# Patient Record
Sex: Male | Born: 1965 | Hispanic: Yes | Marital: Single | Smoking: Former smoker
Health system: Southern US, Community
[De-identification: ages and names within clinical notes are randomized; demographics above are authoritative.]

## PROBLEM LIST (undated history)

## (undated) DIAGNOSIS — I10 Essential (primary) hypertension: Secondary | ICD-10-CM

## (undated) DIAGNOSIS — F1010 Alcohol abuse, uncomplicated: Secondary | ICD-10-CM

## (undated) DIAGNOSIS — R109 Unspecified abdominal pain: Secondary | ICD-10-CM

## (undated) DIAGNOSIS — L409 Psoriasis, unspecified: Secondary | ICD-10-CM

---
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
## 2017-03-07 ENCOUNTER — Encounter: Payer: Self-pay | Admitting: Emergency Medicine

## 2017-03-07 ENCOUNTER — Inpatient Hospital Stay
Admission: EM | Admit: 2017-03-07 | Discharge: 2017-03-08 | DRG: 683 | Disposition: A | Discharge status: Home / Self Care | Payer: Self-pay | Attending: Specialist | Admitting: Specialist

## 2017-03-07 DIAGNOSIS — N179 Acute kidney failure, unspecified: Principal | ICD-10-CM | POA: Diagnosis present

## 2017-03-07 DIAGNOSIS — I10 Essential (primary) hypertension: Secondary | ICD-10-CM | POA: Diagnosis present

## 2017-03-07 DIAGNOSIS — R197 Diarrhea, unspecified: Secondary | ICD-10-CM

## 2017-03-07 DIAGNOSIS — E861 Hypovolemia: Secondary | ICD-10-CM | POA: Diagnosis present

## 2017-03-07 DIAGNOSIS — E860 Dehydration: Secondary | ICD-10-CM | POA: Diagnosis present

## 2017-03-07 DIAGNOSIS — Z87891 Personal history of nicotine dependence: Secondary | ICD-10-CM

## 2017-03-07 DIAGNOSIS — A080 Rotaviral enteritis: Secondary | ICD-10-CM | POA: Diagnosis present

## 2017-03-07 HISTORY — DX: Essential (primary) hypertension: I10

## 2017-03-07 LAB — COMPREHENSIVE METABOLIC PANEL
ALK PHOS: 116 U/L (ref 38–126)
ALT: 45 U/L (ref 17–63)
ANION GAP: 13 (ref 5–15)
AST: 53 U/L — ABNORMAL HIGH (ref 15–41)
Albumin: 4.8 g/dL (ref 3.5–5.0)
BUN: 18 mg/dL (ref 6–20)
CALCIUM: 9.7 mg/dL (ref 8.9–10.3)
CO2: 19 mmol/L — AB (ref 22–32)
Chloride: 102 mmol/L (ref 101–111)
Creatinine, Ser: 2.22 mg/dL — ABNORMAL HIGH (ref 0.61–1.24)
GFR calc non Af Amer: 33 mL/min — ABNORMAL LOW (ref >60)
GFR, EST AFRICAN AMERICAN: 39 mL/min — AB (ref >60)
Glucose, Bld: 124 mg/dL — ABNORMAL HIGH (ref 65–99)
Potassium: 4 mmol/L (ref 3.5–5.1)
SODIUM: 134 mmol/L — AB (ref 135–145)
TOTAL PROTEIN: 9.7 g/dL — AB (ref 6.5–8.1)
Total Bilirubin: 0.9 mg/dL (ref 0.3–1.2)

## 2017-03-07 LAB — URINALYSIS, COMPLETE (UACMP) WITH MICROSCOPIC
BILIRUBIN URINE: NEGATIVE
Glucose, UA: NEGATIVE mg/dL
Ketones, ur: NEGATIVE mg/dL
LEUKOCYTES UA: NEGATIVE
NITRITE: NEGATIVE
PH: 5 (ref 5.0–8.0)
Protein, ur: NEGATIVE mg/dL
SPECIFIC GRAVITY, URINE: 1.004 — AB (ref 1.005–1.030)

## 2017-03-07 LAB — GASTROINTESTINAL PANEL BY PCR, STOOL (REPLACES STOOL CULTURE)
ASTROVIRUS: NOT DETECTED
Adenovirus F40/41: NOT DETECTED
Campylobacter species: NOT DETECTED
Cryptosporidium: NOT DETECTED
Cyclospora cayetanensis: NOT DETECTED
ENTAMOEBA HISTOLYTICA: NOT DETECTED
ENTEROAGGREGATIVE E COLI (EAEC): NOT DETECTED
ENTEROTOXIGENIC E COLI (ETEC): NOT DETECTED
Enteropathogenic E coli (EPEC): NOT DETECTED
Giardia lamblia: NOT DETECTED
NOROVIRUS GI/GII: NOT DETECTED
Plesimonas shigelloides: NOT DETECTED
ROTAVIRUS A: DETECTED — AB
SALMONELLA SPECIES: NOT DETECTED
SAPOVIRUS (I, II, IV, AND V): NOT DETECTED
SHIGA LIKE TOXIN PRODUCING E COLI (STEC): NOT DETECTED
SHIGELLA/ENTEROINVASIVE E COLI (EIEC): NOT DETECTED
VIBRIO CHOLERAE: NOT DETECTED
Vibrio species: NOT DETECTED
Yersinia enterocolitica: NOT DETECTED

## 2017-03-07 LAB — CBC
HCT: 54.2 % — ABNORMAL HIGH (ref 40.0–52.0)
HEMOGLOBIN: 18.4 g/dL — AB (ref 13.0–18.0)
MCH: 29 pg (ref 26.0–34.0)
MCHC: 34 g/dL (ref 32.0–36.0)
MCV: 85.2 fL (ref 80.0–100.0)
Platelets: 329 10*3/uL (ref 150–440)
RBC: 6.36 MIL/uL — AB (ref 4.40–5.90)
RDW: 13.9 % (ref 11.5–14.5)
WBC: 9.1 10*3/uL (ref 3.8–10.6)

## 2017-03-07 LAB — C DIFFICILE QUICK SCREEN W PCR REFLEX
C DIFFICILE (CDIFF) INTERP: NOT DETECTED
C DIFFICILE (CDIFF) TOXIN: NEGATIVE
C DIFFICLE (CDIFF) ANTIGEN: NEGATIVE

## 2017-03-07 LAB — LIPASE, BLOOD: LIPASE: 30 U/L (ref 11–51)

## 2017-03-07 MED ORDER — SODIUM CHLORIDE 0.9 % IV BOLUS (SEPSIS)
1000.0000 mL | Freq: Once | INTRAVENOUS | Status: AC
  Administered 2017-03-07: 1000 mL via INTRAVENOUS

## 2017-03-07 MED ORDER — LOPERAMIDE HCL 2 MG PO CAPS
2.0000 mg | ORAL_CAPSULE | Freq: Four times a day (QID) | ORAL | Status: DC | PRN
  Administered 2017-03-08 (×2): 2 mg via ORAL
  Filled 2017-03-07 (×2): qty 1

## 2017-03-07 MED ORDER — HEPARIN SODIUM (PORCINE) 5000 UNIT/ML IJ SOLN
5000.0000 [IU] | Freq: Three times a day (TID) | INTRAMUSCULAR | Status: DC
  Administered 2017-03-07 – 2017-03-08 (×2): 5000 [IU] via SUBCUTANEOUS
  Filled 2017-03-07 (×2): qty 1

## 2017-03-07 MED ORDER — ACETAMINOPHEN 500 MG PO TABS
1000.0000 mg | ORAL_TABLET | Freq: Once | ORAL | Status: AC
  Administered 2017-03-07: 1000 mg via ORAL
  Filled 2017-03-07: qty 2

## 2017-03-07 MED ORDER — ACETAMINOPHEN 650 MG RE SUPP: 650.0000 mg | Freq: Four times a day (QID) | RECTAL | Status: DC | PRN

## 2017-03-07 MED ORDER — ACETAMINOPHEN 325 MG PO TABS
650.0000 mg | ORAL_TABLET | Freq: Four times a day (QID) | ORAL | Status: DC | PRN
  Administered 2017-03-07 – 2017-03-08 (×2): 650 mg via ORAL
  Filled 2017-03-07 (×2): qty 2

## 2017-03-07 MED ORDER — SODIUM CHLORIDE 0.9 % IV SOLN
INTRAVENOUS | Status: DC
  Administered 2017-03-07 – 2017-03-08 (×3): via INTRAVENOUS

## 2017-03-07 NOTE — ED Notes (Signed)
Lab results available reviewed, Creat level noted. No previous labs in computer to compare to.

## 2017-03-07 NOTE — H&P (Signed)
Christopher Norton is an 51 y.o. male.   Chief Complaint: Diarrhea HPI: This is a 51 year old Hispanic male. He says last Monday started having fever and diarrhea. He also had body aches and was taking ibuprofen for couple days. Diarrhea has not slowed. He says had 10 or more stools per day. He's also been taking Lasix for lower extremity edema and has continued to take it during that time. He's been out to eat and drink but he has diarrhea immediately afterwards. Here in the ER is found to be in acute renal failure and hospitalist services were insulted for admission.  Past Medical History:  Diagnosis Date  . Hypertension     History reviewed. No pertinent surgical history.  No family history on file. Social History:  reports that he has quit smoking. He has never used smokeless tobacco. He reports that he drinks alcohol. He reports that he does not use drugs.  Allergies: No Known Allergies   (Not in a hospital admission)  Results for orders placed or performed during the hospital encounter of 03/07/17 (from the past 48 hour(s))  Lipase, blood     Status: None   Collection Time: 03/07/17  9:46 AM  Result Value Ref Range   Lipase 30 11 - 51 U/L  Comprehensive metabolic panel     Status: Abnormal   Collection Time: 03/07/17  9:46 AM  Result Value Ref Range   Sodium 134 (L) 135 - 145 mmol/L   Potassium 4.0 3.5 - 5.1 mmol/L   Chloride 102 101 - 111 mmol/L   CO2 19 (L) 22 - 32 mmol/L   Glucose, Bld 124 (H) 65 - 99 mg/dL   BUN 18 6 - 20 mg/dL   Creatinine, Ser 2.22 (H) 0.61 - 1.24 mg/dL   Calcium 9.7 8.9 - 10.3 mg/dL   Total Protein 9.7 (H) 6.5 - 8.1 g/dL   Albumin 4.8 3.5 - 5.0 g/dL   AST 53 (H) 15 - 41 U/L   ALT 45 17 - 63 U/L   Alkaline Phosphatase 116 38 - 126 U/L   Total Bilirubin 0.9 0.3 - 1.2 mg/dL   GFR calc non Af Amer 33 (L) >60 mL/min   GFR calc Af Amer 39 (L) >60 mL/min    Comment: (NOTE) The eGFR has been calculated using the CKD EPI equation. This  calculation has not been validated in all clinical situations. eGFR's persistently <60 mL/min signify possible Chronic Kidney Disease.    Anion gap 13 5 - 15  CBC     Status: Abnormal   Collection Time: 03/07/17  9:46 AM  Result Value Ref Range   WBC 9.1 3.8 - 10.6 K/uL   RBC 6.36 (H) 4.40 - 5.90 MIL/uL   Hemoglobin 18.4 (H) 13.0 - 18.0 g/dL   HCT 54.2 (H) 40.0 - 52.0 %   MCV 85.2 80.0 - 100.0 fL   MCH 29.0 26.0 - 34.0 pg   MCHC 34.0 32.0 - 36.0 g/dL   RDW 13.9 11.5 - 14.5 %   Platelets 329 150 - 440 K/uL    Comment: PLATELET CLUMPS NOTED ON SMEAR, COUNT APPEARS ADEQUATE  Urinalysis, Complete w Microscopic     Status: Abnormal   Collection Time: 03/07/17  1:04 PM  Result Value Ref Range   Color, Urine YELLOW (A) YELLOW   APPearance CLEAR (A) CLEAR   Specific Gravity, Urine 1.004 (L) 1.005 - 1.030   pH 5.0 5.0 - 8.0   Glucose, UA NEGATIVE NEGATIVE mg/dL  Hgb urine dipstick SMALL (A) NEGATIVE   Bilirubin Urine NEGATIVE NEGATIVE   Ketones, ur NEGATIVE NEGATIVE mg/dL   Protein, ur NEGATIVE NEGATIVE mg/dL   Nitrite NEGATIVE NEGATIVE   Leukocytes, UA NEGATIVE NEGATIVE   RBC / HPF 0-5 0 - 5 RBC/hpf   WBC, UA 0-5 0 - 5 WBC/hpf   Bacteria, UA RARE (A) NONE SEEN   Squamous Epithelial / LPF 0-5 (A) NONE SEEN   Mucous PRESENT    Hyaline Casts, UA PRESENT    No results found.  Review of Systems  Constitutional: Negative for chills and fever.  HENT: Negative for hearing loss.   Eyes: Negative for blurred vision.  Respiratory: Negative for cough.   Cardiovascular: Negative for chest pain.  Gastrointestinal: Positive for diarrhea.  Genitourinary: Negative for dysuria.  Musculoskeletal: Negative for myalgias.  Skin: Negative for rash.  Neurological: Negative for dizziness.    Blood pressure 117/84, pulse 82, temperature 99.9 F (37.7 C), temperature source Oral, resp. rate 15, SpO2 97 %. Physical Exam  Constitutional: He is oriented to person, place, and time. He appears  well-developed and well-nourished. No distress.  HENT:  Head: Normocephalic.  Oral mucosa dry  Eyes: Pupils are equal, round, and reactive to light. EOM are normal.  Neck: Neck supple. No JVD present. No tracheal deviation present. No thyromegaly present.  Cardiovascular: Normal rate and regular rhythm.   No murmur heard. Respiratory: Effort normal and breath sounds normal. No respiratory distress. He has no wheezes.  GI: Soft. Bowel sounds are normal. He exhibits no distension. There is no tenderness.  Musculoskeletal: Normal range of motion. He exhibits no edema or tenderness.  Lymphadenopathy:    He has no cervical adenopathy.  Neurological: He is alert and oriented to person, place, and time. No cranial nerve deficit. Coordination normal.  Skin: Skin is warm and dry.     Assessment/Plan 1. Acute renal failure. Likely mostly from hypovolemia. Also he was taking Lasix certainly impact of this. He was taking NSAIDs for short time which may of caused some acute kidney injury also. We will hold any NSAIDs, Lasix and his lisinopril. We'll give him aggressive IV hydration and recheck his renal function. 2. Diarrhea. He's had some fever with this so suspect infectious in nature. Stool for culture and C. difficile have been ordered and are still pending. If these are positive then we'll initiate antibiotic treatment that's appropriate. If negative then this may be viral and will give supportive care while (course. 3. Hypertension. We will hold his lisinopril because his renal function. We'll monitor his blood pressure at another class of medication if needed. Next line Total time spent 35 minutes  Baxter Hire, MD 03/07/2017, 2:34 PM

## 2017-03-07 NOTE — ED Provider Notes (Signed)
[facility] Emergency Department Provider Note  Time seen: 11:21 AM  I have reviewed the triage vital signs and the nursing notes.   HISTORY  Chief Complaint Back Pain; Fever; and Diarrhea    HPI Christopher Norton is a 51 y.o. male with a past medical history of hypertension who presents to the emergency department for fever, weakness and diarrhea. According to the patient for the past 4 days he has been feeling unwell which she describes a subjective fever with very frequent episodes of diarrhea. States he's having up to 20 episodes of diarrhea per day which continues today. Denies any vomiting. Patient states subjective fever at home, 99.9 in the emergency department. States abdominal cramping but denies any "pain." Patient states feelings of generalized fatigue/weakness. He also states diffuse body aches/cramps.  Past Medical History:  Diagnosis Date  . Hypertension     There are no active problems to display for this patient.   History reviewed. No pertinent surgical history.  Prior to Admission medications   Not on File    No Known Allergies  No family history on file.  Social History Social History  Substance Use Topics  . Smoking status: Former Games developermoker  . Smokeless tobacco: Never Used  . Alcohol use Yes    Review of Systems Constitutional: Positive for subjective fever Cardiovascular: Negative for chest pain. Respiratory: Negative for shortness of breath. Gastrointestinal: Positive for abdominal cramping. Negative for vomiting. Positive for diarrhea. Genitourinary: Negative for dysuria. Negative for hematuria Musculoskeletal: Positive for mild lower back pain Neurological: Negative for headache All other ROS negative  ____________________________________________   PHYSICAL EXAM:  VITAL SIGNS: ED Triage Vitals [03/07/17 0940]  Enc Vitals Group     BP (!) 146/128     Pulse Rate (!) 110     Resp 16     Temp 99.9 F  (37.7 C)     Temp Source Oral     SpO2 97 %     Weight      Height      Head Circumference      Peak Flow      Pain Score 8     Pain Loc      Pain Edu?      Excl. in GC?     Constitutional: Alert and oriented. Well appearing and in no distress. Eyes: Normal exam ENT   Head: Normocephalic and atraumatic.   Mouth/Throat: Mucous membranes are moist. Cardiovascular: Normal rate, regular rhythm. No murmur Respiratory: Normal respiratory effort without tachypnea nor retractions. Breath sounds are clear  Gastrointestinal: Soft, nontender, no distention. Musculoskeletal: Nontender with normal range of motion in all extremities. Neurologic:  Normal speech and language. No gross focal neurologic deficits  Skin:  Skin is warm, dry and intact.  Psychiatric: Mood and affect are normal.   ____________________________________________    INITIAL IMPRESSION / ASSESSMENT AND PLAN / ED COURSE  Pertinent labs & imaging results that were available during my care of the patient were reviewed by me and considered in my medical decision making (see chart for details).  Patient presents to the emergency department with subjective fever and diarrhea and generalized weakness. Patient's labs show a creatinine of 2.22, baseline creatinine of 0.8-1.0. Anion gap of 13 with a concentrated hemoglobin most consistent with significant dehydration. Patient denies any vomiting but continues to state up to 20 bowel movements per day of diarrhea. We will place on enteric precautions, obtain a C. difficile and bio fire test.  We will begin IV hydration. Given the patient's generalized weakness with acute renal insufficiency, the patient would most likely benefit from admission to the hospital for further treatment. We will discuss this once further labs are known.   Urinalysis is largely negative. Highly suspect acute renal insufficiency due to dehydration and significant diarrhea. We'll admit to the hospital for  further IV hydration while awaiting stool results.  FINAL CLINICAL IMPRESSION(S) / ED DIAGNOSES acute renal insufficiency Diarrhea   Minna AntisPaduchowski, Audrionna Lampton, MD 03/07/17 1346

## 2017-03-07 NOTE — ED Triage Notes (Signed)
Pt to ED via POV, pt states that he has been having joint pain, fever, diarrhea, and lower back pain since Monday. Pt denies any known tick bites recently. Pt does not appear to be in any distress at this time.

## 2017-03-07 NOTE — ED Notes (Signed)
Patient here for joint pain, fever, diarrhea, and lower back pain since Monday. Patient ambulatory to room. Even and non labored respirations noted.

## 2017-03-07 NOTE — ED Notes (Signed)
Patient made aware of need of urine and stool sample. States he is unable to go at this time. Patient given urinal. Hat in toilet.

## 2017-03-07 NOTE — Progress Notes (Signed)
Pt complained of having multiple loose BM. Primary nurse  notified Dr. Anne HahnWillis. Dr. Anne HahnWillis to place orders. Primary nurse to continue to monitor.

## 2017-03-08 LAB — BASIC METABOLIC PANEL
ANION GAP: 7 (ref 5–15)
BUN: 14 mg/dL (ref 6–20)
CO2: 21 mmol/L — ABNORMAL LOW (ref 22–32)
CREATININE: 1.05 mg/dL (ref 0.61–1.24)
Calcium: 8.6 mg/dL — ABNORMAL LOW (ref 8.9–10.3)
Chloride: 112 mmol/L — ABNORMAL HIGH (ref 101–111)
Glucose, Bld: 89 mg/dL (ref 65–99)
Potassium: 4.4 mmol/L (ref 3.5–5.1)
SODIUM: 140 mmol/L (ref 135–145)

## 2017-03-08 MED ORDER — LOPERAMIDE HCL 2 MG PO CAPS: 2.0000 mg | ORAL_CAPSULE | Freq: Four times a day (QID) | ORAL | 0 refills | Status: DC | PRN

## 2017-03-08 NOTE — Progress Notes (Signed)
Pt to be discharged per MD order. IV removed. Imodium script given. Pt verbalizes understanding of the discharge instructions.

## 2017-03-09 LAB — HIV ANTIBODY (ROUTINE TESTING W REFLEX): HIV Screen 4th Generation wRfx: NONREACTIVE

## 2017-03-09 NOTE — Discharge Summary (Signed)
[facility] [place] [facility]   PATIENT NAME: Christopher Norton    MR#:  [id number]  DATE OF BIRTH:  [birth year]  DATE OF ADMISSION:  03/07/2017 ADMITTING PHYSICIAN: Gracelyn Nurse, MD  DATE OF DISCHARGE: 03/08/2017 12:03 PM  PRIMARY CARE PHYSICIAN: Patient, No Pcp Per    ADMISSION DIAGNOSIS:  Dehydration [E86.0] AKI (acute kidney injury) (HCC) [N17.9] Diarrhea, unspecified type [R19.7]  DISCHARGE DIAGNOSIS:  Active Problems:   Acute renal failure (ARF) (HCC)   SECONDARY DIAGNOSIS:   Past Medical History:  Diagnosis Date  . Hypertension     HOSPITAL COURSE:   51 year old male with past medical history of essential hypertension who presented to the hospital due to worsening diarrhea and noted to be in acute kidney injury.   1. Acute diarrhea-this is secondary to acute viral gastroenteritis secondary to rotavirus. -Patient was treated supportively with IV fluids, antiemetics and supportive care and has improved. He has no further diarrhea. He was discharged on some Imodium as needed.  2. Acute kidney injury-secondary to dehydration from the diarrhea. Patient was hydrated with IV fluids and creatinine has not improved and is back to baseline.  DISCHARGE CONDITIONS:   Stable  CONSULTS OBTAINED:    DRUG ALLERGIES:  No Known Allergies  DISCHARGE MEDICATIONS:   Allergies as of 03/08/2017   No Known Allergies     Medication List    TAKE these medications   acetaminophen 500 MG tablet Commonly known as:  TYLENOL Take 500 mg by mouth every 6 (six) hours as needed. Notes to patient:  Last dose given today at 6:30am   loperamide 2 MG capsule Commonly known as:  IMODIUM Take 1 capsule (2 mg total) by mouth every 6 (six) hours as needed for diarrhea or loose stools. Notes to patient:  Last dose given today at 6:30am         DISCHARGE INSTRUCTIONS:   DIET:  Regular diet  DISCHARGE CONDITION:  Stable  ACTIVITY:  Activity as  tolerated  OXYGEN:  Home Oxygen: No.   Oxygen Delivery: room air  DISCHARGE LOCATION:  home   If you experience worsening of your admission symptoms, develop shortness of breath, life threatening emergency, suicidal or homicidal thoughts you must seek medical attention immediately by calling 911 or calling your MD immediately  if symptoms less severe.  You Must read complete instructions/literature along with all the possible adverse reactions/side effects for all the Medicines you take and that have been prescribed to you. Take any new Medicines after you have completely understood and accpet all the possible adverse reactions/side effects.   Please note  You were cared for by a hospitalist during your hospital stay. If you have any questions about your discharge medications or the care you received while you were in the hospital after you are discharged, you can call the unit and asked to speak with the hospitalist on call if the hospitalist that took care of you is not available. Once you are discharged, your primary care physician will handle any further medical issues. Please note that NO REFILLS for any discharge medications will be authorized once you are discharged, as it is imperative that you return to your primary care physician (or establish a relationship with a primary care physician if you do not have one) for your aftercare needs so that they can reassess your need for medications and monitor your lab values.     Today   No abdominal pain, nausea, vomiting. No diarrhea.  VITAL SIGNS:  Blood pressure 116/79, pulse 71, temperature 98.2 F (36.8 C), temperature source Oral, resp. rate 16, SpO2 99 %.  I/O:  No intake or output data in the 24 hours ending 03/09/17 1401  PHYSICAL EXAMINATION:  GENERAL:  51 y.o.-year-old obese patient lying in the bed with no acute distress.  EYES: Pupils equal, round, reactive to light and accommodation. No scleral icterus. Extraocular  muscles intact.  HEENT: Head atraumatic, normocephalic. Oropharynx and nasopharynx clear.  NECK:  Supple, no jugular venous distention. No thyroid enlargement, no tenderness.  LUNGS: Normal breath sounds bilaterally, no wheezing, rales,rhonchi. No use of accessory muscles of respiration.  CARDIOVASCULAR: S1, S2 normal. No murmurs, rubs, or gallops.  ABDOMEN: Soft, non-tender, non-distended. Bowel sounds present. No organomegaly or mass.  EXTREMITIES: No pedal edema, cyanosis, or clubbing.  NEUROLOGIC: Cranial nerves II through XII are intact. No focal motor or sensory defecits b/l.  PSYCHIATRIC: The patient is alert and oriented x 3. Good affect.  SKIN: No obvious rash, lesion, or ulcer. Scaly white rash throughout the lower extremity and also lower back consistent with psoriasis.  DATA REVIEW:   CBC  Recent Labs Lab 03/07/17 0946  WBC 9.1  HGB 18.4*  HCT 54.2*  PLT 329    Chemistries   Recent Labs Lab 03/07/17 0946 03/08/17 0433  NA 134* 140  K 4.0 4.4  CL 102 112*  CO2 19* 21*  GLUCOSE 124* 89  BUN 18 14  CREATININE 2.22* 1.05  CALCIUM 9.7 8.6*  AST 53*  --   ALT 45  --   ALKPHOS 116  --   BILITOT 0.9  --     Cardiac Enzymes No results for input(s): TROPONINI in the last 168 hours.  Microbiology Results  Results for orders placed or performed during the hospital encounter of 03/07/17  C difficile quick scan w PCR reflex     Status: None   Collection Time: 03/07/17  1:53 PM  Result Value Ref Range Status   C Diff antigen NEGATIVE NEGATIVE Final   C Diff toxin NEGATIVE NEGATIVE Final   C Diff interpretation No C. difficile detected.  Final  Gastrointestinal Panel by PCR , Stool     Status: Abnormal   Collection Time: 03/07/17  1:53 PM  Result Value Ref Range Status   Campylobacter species NOT DETECTED NOT DETECTED Final   Plesimonas shigelloides NOT DETECTED NOT DETECTED Final   Salmonella species NOT DETECTED NOT DETECTED Final   Yersinia enterocolitica  NOT DETECTED NOT DETECTED Final   Vibrio species NOT DETECTED NOT DETECTED Final   Vibrio cholerae NOT DETECTED NOT DETECTED Final   Enteroaggregative E coli (EAEC) NOT DETECTED NOT DETECTED Final   Enteropathogenic E coli (EPEC) NOT DETECTED NOT DETECTED Final   Enterotoxigenic E coli (ETEC) NOT DETECTED NOT DETECTED Final   Shiga like toxin producing E coli (STEC) NOT DETECTED NOT DETECTED Final   Shigella/Enteroinvasive E coli (EIEC) NOT DETECTED NOT DETECTED Final   Cryptosporidium NOT DETECTED NOT DETECTED Final   Cyclospora cayetanensis NOT DETECTED NOT DETECTED Final   Entamoeba histolytica NOT DETECTED NOT DETECTED Final   Giardia lamblia NOT DETECTED NOT DETECTED Final   Adenovirus F40/41 NOT DETECTED NOT DETECTED Final   Astrovirus NOT DETECTED NOT DETECTED Final   Norovirus GI/GII NOT DETECTED NOT DETECTED Final   Rotavirus A DETECTED (A) NOT DETECTED Final   Sapovirus (I, II, IV, and V) NOT DETECTED NOT DETECTED Final    RADIOLOGY:  No results found.  Management plans discussed with the patient, family and they are in agreement.  CODE STATUS:  Code Status History    Date Active Date Inactive Code Status Order ID Comments User Context   03/07/2017  4:21 PM 03/08/2017  3:03 PM Full Code [id number]  Gracelyn NurseJohnston, John D, MD Inpatient      TOTAL TIME TAKING CARE OF THIS PATIENT: 40 minutes.    [place] SirenSAINANI,VIVEK J M.D on 03/09/2017 at 2:01 PM  Between 7am to 6pm - Pager - [phone]  After 6pm go to [email or web address] - Social research officer, [email or web address] [email or web address]  [facility] [place] [facility]  Office  [phone]  CC: Primary care physician; Patient, No Pcp Per

## 2018-04-29 ENCOUNTER — Encounter: Payer: Self-pay | Admitting: Emergency Medicine

## 2018-04-29 ENCOUNTER — Emergency Department
Admission: EM | Admit: 2018-04-29 | Discharge: 2018-04-29 | Disposition: A | Discharge status: Home / Self Care | Payer: Self-pay | Attending: Emergency Medicine | Admitting: Emergency Medicine

## 2018-04-29 ENCOUNTER — Other Ambulatory Visit: Payer: Self-pay

## 2018-04-29 DIAGNOSIS — Z87891 Personal history of nicotine dependence: Secondary | ICD-10-CM | POA: Insufficient documentation

## 2018-04-29 DIAGNOSIS — M5442 Lumbago with sciatica, left side: Secondary | ICD-10-CM | POA: Insufficient documentation

## 2018-04-29 DIAGNOSIS — M62830 Muscle spasm of back: Secondary | ICD-10-CM | POA: Insufficient documentation

## 2018-04-29 DIAGNOSIS — M5432 Sciatica, left side: Secondary | ICD-10-CM

## 2018-04-29 DIAGNOSIS — Z79899 Other long term (current) drug therapy: Secondary | ICD-10-CM | POA: Insufficient documentation

## 2018-04-29 DIAGNOSIS — I10 Essential (primary) hypertension: Secondary | ICD-10-CM | POA: Insufficient documentation

## 2018-04-29 MED ORDER — OXYCODONE-ACETAMINOPHEN 5-325 MG PO TABS
1.0000 | ORAL_TABLET | Freq: Once | ORAL | Status: AC
  Administered 2018-04-29: 1 via ORAL
  Filled 2018-04-29: qty 1

## 2018-04-29 MED ORDER — IBUPROFEN 800 MG PO TABS: 800.0000 mg | ORAL_TABLET | Freq: Three times a day (TID) | ORAL | 0 refills | Status: DC | PRN

## 2018-04-29 MED ORDER — OXYCODONE-ACETAMINOPHEN 5-325 MG PO TABS: 1.0000 | ORAL_TABLET | ORAL | 0 refills | Status: DC | PRN

## 2018-04-29 MED ORDER — DIAZEPAM 2 MG PO TABS
2.0000 mg | ORAL_TABLET | Freq: Once | ORAL | Status: AC
  Administered 2018-04-29: 2 mg via ORAL
  Filled 2018-04-29: qty 1

## 2018-04-29 MED ORDER — KETOROLAC TROMETHAMINE 30 MG/ML IJ SOLN
60.0000 mg | Freq: Once | INTRAMUSCULAR | Status: AC
  Administered 2018-04-29: 60 mg via INTRAMUSCULAR
  Filled 2018-04-29: qty 2

## 2018-04-29 MED ORDER — DIAZEPAM 2 MG PO TABS: 2.0000 mg | ORAL_TABLET | Freq: Three times a day (TID) | ORAL | 0 refills | Status: DC | PRN

## 2018-04-29 NOTE — ED Triage Notes (Signed)
Patient ambulatory to triage with slow steady gait, without difficulty or distress noted; pt reports left lower back/hip pain since last night radiating down left leg with no known injuries; st hx of same and rx flexeril--took ds 3hrs PTA without relief

## 2018-04-29 NOTE — Discharge Instructions (Signed)
1.  You may take medicines as needed for pain and muscle spasms (Motrin/Percocet/Valium #15). 2.  Apply moist heat to affected area several times daily. 3.  Return to the ER for worsening symptoms, persistent vomiting, difficulty breathing, losing control of your bowel or bladder, or other concerns.

## 2018-04-29 NOTE — ED Provider Notes (Signed)
[facility] Emergency Department Provider Note   ____________________________________________   First MD Initiated Contact with Patient 04/29/18 0425     (approximate)  I have reviewed the triage vital signs and the nursing notes.   HISTORY  Chief Complaint Back Pain    HPI Christopher Norton is a 52 y.o. male who presents to the ED from home with a chief complaint of nontraumatic back pain.  Patient reports pain to his left lower back radiating to his hip and down his leg starting last night approximately 11 PM.  Took a Flexeril 3 hours prior to arrival without relief of symptoms.  Similar symptoms 6 to 7 years ago.  Denies recent fever, chills, chest pain, shortness of breath, abdominal pain, nausea, vomiting or dysuria.  Denies extremity weakness, numbness or tingling.  Denies recent travel or trauma.  Denies anticoagulant use.   Past Medical History:  Diagnosis Date  . Hypertension     Patient Active Problem List   Diagnosis Date Noted  . Acute renal failure (ARF) (HCC) 03/07/2017    History reviewed. No pertinent surgical history.  Prior to Admission medications   Medication Sig Start Date End Date Taking? Authorizing Provider  acetaminophen (TYLENOL) 500 MG tablet Take 500 mg by mouth every 6 (six) hours as needed.    [provider]  diazepam (VALIUM) 2 MG tablet Take 1 tablet (2 mg total) by mouth every 8 (eight) hours as needed for muscle spasms. 04/29/18   Irean HongSung, Aileene Lanum J, MD  ibuprofen (ADVIL,MOTRIN) 800 MG tablet Take 1 tablet (800 mg total) by mouth every 8 (eight) hours as needed for moderate pain. 04/29/18   Irean HongSung, Parisha Beaulac J, MD  loperamide (IMODIUM) 2 MG capsule Take 1 capsule (2 mg total) by mouth every 6 (six) hours as needed for diarrhea or loose stools. 03/08/17   Houston SirenSainani, Vivek J, MD  oxyCODONE-acetaminophen (PERCOCET/ROXICET) 5-325 MG tablet Take 1 tablet by mouth every 4 (four) hours as needed for severe pain. 04/29/18    Irean HongSung, Joyclyn Plazola J, MD    Allergies Patient has no known allergies.  No family history on file.  Social History Social History   Tobacco Use  . Smoking status: Former Smoker    Types: Cigarettes    Last attempt to quit: 08/09/2016    Years since quitting: 1.7  . Smokeless tobacco: Never Used  Substance Use Topics  . Alcohol use: Yes    Alcohol/week: 12.0 standard drinks    Types: 12 Cans of beer per week  . Drug use: No    Review of Systems  Constitutional: No fever/chills Eyes: No visual changes. ENT: No sore throat. Cardiovascular: Denies chest pain. Respiratory: Denies shortness of breath. Gastrointestinal: No abdominal pain.  No nausea, no vomiting.  No diarrhea.  No constipation. Genitourinary: Negative for dysuria. Musculoskeletal: Positive for back pain. Skin: Negative for rash. Neurological: Negative for headaches, focal weakness or numbness.   ____________________________________________   PHYSICAL EXAM:  VITAL SIGNS: ED Triage Vitals  Enc Vitals Group     BP 04/29/18 0232 (!) 136/92     Pulse Rate 04/29/18 0232 (!) 107     Resp 04/29/18 0232 18     Temp 04/29/18 0232 98 F (36.7 C)     Temp Source 04/29/18 0232 Oral     SpO2 04/29/18 0232 97 %     Weight 04/29/18 0231 248 lb (112.5 kg)     Height 04/29/18 0231 5\' 6"  (1.676 m)  Head Circumference --      Peak Flow --      Pain Score 04/29/18 0231 10     Pain Loc --      Pain Edu? --      Excl. in GC? --     Constitutional: Alert and oriented. Well appearing and in mild acute distress. Eyes: Conjunctivae are normal. PERRL. EOMI. Head: Atraumatic. Nose: No congestion/rhinnorhea. Mouth/Throat: Mucous membranes are moist.  Oropharynx non-erythematous. Neck: No stridor.  No cervical spine tenderness to palpation. Cardiovascular: Normal rate, regular rhythm. Grossly normal heart sounds.  Good peripheral circulation. Respiratory: Normal respiratory effort.  No retractions. Lungs  CTAB. Gastrointestinal: Soft and nontender. No distention. No abdominal bruits. No CVA tenderness. Musculoskeletal: No spinal tenderness to palpation.  Left paraspinal lumbar spasms.  Negative straight leg raise.  No lower extremity tenderness nor edema.  No joint effusions. Neurologic:  Normal speech and language. No gross focal neurologic deficits are appreciated.  Ambulating with slow but steady gait. Skin:  Skin is warm, dry and intact.  Psoriatic plaques noted.   Psychiatric: Mood and affect are normal. Speech and behavior are normal.  ____________________________________________   LABS (all labs ordered are listed, but only abnormal results are displayed)  Labs Reviewed - No data to display ____________________________________________  EKG  None ____________________________________________  RADIOLOGY  ED MD interpretation: None  Official radiology report(s): No results found.  ____________________________________________   PROCEDURES  Procedure(s) performed: None  Procedures  Critical Care performed: No  ____________________________________________   INITIAL IMPRESSION / ASSESSMENT AND PLAN / ED COURSE  As part of my medical decision making, I reviewed the following data within the electronic MEDICAL RECORD NUMBER Nursing notes reviewed and incorporated and Notes from prior ED visits   52 year old presents with left lower back pain suggestive of sciatica.  Will treat with NSAIDs, analgesia, muscle relaxer and patient will follow-up with orthopedics.  Strict return precautions given.  Patient and spouse verbalize understanding and agree with plan of care.      ____________________________________________   FINAL CLINICAL IMPRESSION(S) / ED DIAGNOSES  Final diagnoses:  Sciatica of left side  Muscle spasm of back     ED Discharge Orders         Ordered    ibuprofen (ADVIL,MOTRIN) 800 MG tablet  Every 8 hours PRN     04/29/18 0445     oxyCODONE-acetaminophen (PERCOCET/ROXICET) 5-325 MG tablet  Every 4 hours PRN     04/29/18 0445    diazepam (VALIUM) 2 MG tablet  Every 8 hours PRN     04/29/18 0445           Note:  This document was prepared using Dragon voice recognition software and may include unintentional dictation errors.    Irean Hong, MD 04/29/18 [phone]

## 2019-06-15 ENCOUNTER — Encounter: Payer: Self-pay | Admitting: Emergency Medicine

## 2019-06-15 ENCOUNTER — Emergency Department
Admission: EM | Admit: 2019-06-15 | Discharge: 2019-06-15 | Disposition: A | Discharge status: Home / Self Care | Payer: Self-pay | Attending: Emergency Medicine | Admitting: Emergency Medicine

## 2019-06-15 ENCOUNTER — Emergency Department: Payer: Self-pay

## 2019-06-15 ENCOUNTER — Other Ambulatory Visit: Payer: Self-pay

## 2019-06-15 DIAGNOSIS — Z20822 Contact with and (suspected) exposure to covid-19: Secondary | ICD-10-CM

## 2019-06-15 DIAGNOSIS — Z87891 Personal history of nicotine dependence: Secondary | ICD-10-CM | POA: Insufficient documentation

## 2019-06-15 DIAGNOSIS — I10 Essential (primary) hypertension: Secondary | ICD-10-CM | POA: Insufficient documentation

## 2019-06-15 DIAGNOSIS — R519 Headache, unspecified: Secondary | ICD-10-CM | POA: Insufficient documentation

## 2019-06-15 LAB — COMPREHENSIVE METABOLIC PANEL
ALT: 175 U/L — ABNORMAL HIGH (ref 0–44)
AST: 203 U/L — ABNORMAL HIGH (ref 15–41)
Albumin: 4 g/dL (ref 3.5–5.0)
Alkaline Phosphatase: 97 U/L (ref 38–126)
Anion gap: 13 (ref 5–15)
BUN: 13 mg/dL (ref 6–20)
CO2: 23 mmol/L (ref 22–32)
Calcium: 9.1 mg/dL (ref 8.9–10.3)
Chloride: 99 mmol/L (ref 98–111)
Creatinine, Ser: 1.17 mg/dL (ref 0.61–1.24)
GFR calc Af Amer: >60 mL/min (ref >60)
GFR calc non Af Amer: >60 mL/min (ref >60)
Glucose, Bld: 104 mg/dL — ABNORMAL HIGH (ref 70–99)
Potassium: 3.8 mmol/L (ref 3.5–5.1)
Sodium: 135 mmol/L (ref 135–145)
Total Bilirubin: 0.7 mg/dL (ref 0.3–1.2)
Total Protein: 8.3 g/dL — ABNORMAL HIGH (ref 6.5–8.1)

## 2019-06-15 LAB — CBC WITH DIFFERENTIAL/PLATELET
Abs Immature Granulocytes: 0.03 10*3/uL (ref 0.00–0.07)
Basophils Absolute: 0 10*3/uL (ref 0.0–0.1)
Basophils Relative: 0 %
Eosinophils Absolute: 0 10*3/uL (ref 0.0–0.5)
Eosinophils Relative: 0 %
HCT: 47.1 % (ref 39.0–52.0)
Hemoglobin: 15.8 g/dL (ref 13.0–17.0)
Immature Granulocytes: 0 %
Lymphocytes Relative: 22 %
Lymphs Abs: 1.5 10*3/uL (ref 0.7–4.0)
MCH: 28.9 pg (ref 26.0–34.0)
MCHC: 33.5 g/dL (ref 30.0–36.0)
MCV: 86.1 fL (ref 80.0–100.0)
Monocytes Absolute: 0.9 10*3/uL (ref 0.1–1.0)
Monocytes Relative: 13 %
Neutro Abs: 4.4 10*3/uL (ref 1.7–7.7)
Neutrophils Relative %: 65 %
Platelets: 205 10*3/uL (ref 150–400)
RBC: 5.47 MIL/uL (ref 4.22–5.81)
RDW: 13 % (ref 11.5–15.5)
WBC: 6.8 10*3/uL (ref 4.0–10.5)
nRBC: 0 % (ref 0.0–0.2)

## 2019-06-15 LAB — TROPONIN I (HIGH SENSITIVITY): Troponin I (High Sensitivity): 10 ng/L (ref <18)

## 2019-06-15 MED ORDER — ACETAMINOPHEN 500 MG PO TABS: 1000.0000 mg | ORAL_TABLET | Freq: Once | ORAL | Status: DC

## 2019-06-15 MED ORDER — ACETAMINOPHEN 500 MG PO TABS
1000.0000 mg | ORAL_TABLET | Freq: Once | ORAL | Status: AC
  Administered 2019-06-15: 1000 mg via ORAL
  Filled 2019-06-15: qty 2

## 2019-06-15 NOTE — ED Notes (Signed)
States his sx's started 6 days ago  But states he conts to have fever and h/a

## 2019-06-15 NOTE — ED Provider Notes (Signed)
[name] Emergency Department Provider Note  ____________________________________________   First MD Initiated Contact with Patient 06/15/19 1443     (approximate)  I have reviewed the triage vital signs and the nursing notes.   HISTORY  Chief Complaint Fever and Headache    HPI Christopher Norton is a 53 y.o. male presents emergency department with complaints of worsening Covid symptoms.  Symptoms for 6 days.  States he is continued to have fever and headaches.  He was taken over-the-counter Tylenol Cold without any relief.  He denies any chest pain or shortness of breath.  He did go to the drive-through today for Covid testing.    Past Medical History:  Diagnosis Date  . Hypertension     Patient Active Problem List   Diagnosis Date Noted  . Acute renal failure (ARF) (HCC) 03/07/2017    History reviewed. No pertinent surgical history.  Prior to Admission medications   Medication Sig Start Date End Date Taking? Authorizing Provider  acetaminophen (TYLENOL) 500 MG tablet Take 500 mg by mouth every 6 (six) hours as needed.    [provider]  diazepam (VALIUM) 2 MG tablet Take 1 tablet (2 mg total) by mouth every 8 (eight) hours as needed for muscle spasms. 04/29/18   Irean Hong, MD    Allergies Patient has no known allergies.  No family history on file.  Social History Social History   Tobacco Use  . Smoking status: Former Smoker    Types: Cigarettes    Quit date: 08/09/2016    Years since quitting: 2.8  . Smokeless tobacco: Never Used  Substance Use Topics  . Alcohol use: Yes    Alcohol/week: 12.0 standard drinks    Types: 12 Cans of beer per week  . Drug use: No    Review of Systems  Constitutional: Positive fever/chills Eyes: No visual changes. ENT: No sore throat. Respiratory: Positive cough Genitourinary: Negative for dysuria. Musculoskeletal: Negative for back pain. Skin: Negative for rash.     ____________________________________________   PHYSICAL EXAM:  VITAL SIGNS: ED Triage Vitals  Enc Vitals Group     BP 06/15/19 1412 (!) 154/104     Pulse Rate 06/15/19 1412 (!) 107     Resp 06/15/19 1412 16     Temp 06/15/19 1412 (!) 100.7 F (38.2 C)     Temp Source 06/15/19 1412 Oral     SpO2 06/15/19 1412 94 %     Weight 06/15/19 1413 242 lb (109.8 kg)     Height 06/15/19 1413 5\' 4"  (1.626 m)     Head Circumference --      Peak Flow --      Pain Score 06/15/19 1412 8     Pain Loc --      Pain Edu? --      Excl. in GC? --     Constitutional: Alert and oriented. Well appearing and in no acute distress. Eyes: Conjunctivae are normal.  Head: Atraumatic. Nose: No congestion/rhinnorhea. Mouth/Throat: Mucous membranes are moist.   Neck:  supple no lymphadenopathy noted Cardiovascular: Normal rate, regular rhythm. Heart sounds are normal Respiratory: Normal respiratory effort.  No retractions, lungs c t a  GU: deferred Musculoskeletal: FROM all extremities, warm and well perfused Neurologic:  Normal speech and language.  Skin:  Skin is warm, dry and intact. No rash noted. Psychiatric: Mood and affect are normal. Speech and behavior are normal.  ____________________________________________   LABS (all labs ordered are listed,  but only abnormal results are displayed)  Labs Reviewed  COMPREHENSIVE METABOLIC PANEL - Abnormal; Notable for the following components:      Result Value   Glucose, Bld 104 (*)    Total Protein 8.3 (*)    AST 203 (*)    ALT 175 (*)    All other components within normal limits  CBC WITH DIFFERENTIAL/PLATELET  TROPONIN I (HIGH SENSITIVITY)  TROPONIN I (HIGH SENSITIVITY)   ____________________________________________   ____________________________________________  RADIOLOGY  Chest x-ray is normal  ____________________________________________   PROCEDURES  Procedure(s) performed: No  Procedures     ____________________________________________   INITIAL IMPRESSION / ASSESSMENT AND PLAN / ED COURSE  Pertinent labs & imaging results that were available during my care of the patient were reviewed by me and considered in my medical decision making (see chart for details).   Patient is a 53 year old male presents emergency department with complaints of Covid symptoms.  Physical exam shows patient to appear well.  Vitals show that he does have a fever and is slightly tachycardic.  Pulse ox shows 94% on room air.  Chest x-ray is normal CBC is normal, comprehensive metabolic panel has increased liver enzymes of AST at 203, ALT 175, troponin is normal   Explained all findings to the patient.  Due to his chest x-ray being normal and not start him on a Z-Pak.  He is to continue to drink water and take Tylenol.  He is to return if worsening.  He was also given a work note stating he should quarantine for an additional 10 days due to the pending Covid test.  I do feel that he does have COVID-19 due to his symptoms.  He states he understands and will comply.  He was discharged in stable condition.  [name] was evaluated in Emergency Department on 06/15/2019 for the symptoms described in the history of present illness. He was evaluated in the context of the global COVID-19 pandemic, which necessitated consideration that the patient might be at risk for infection with the SARS-CoV-2 virus that causes COVID-19. Institutional protocols and algorithms that pertain to the evaluation of patients at risk for COVID-19 are in a state of rapid change based on information released by regulatory bodies including the CDC and federal and state organizations. These policies and algorithms were followed during the patient's care in the ED.   As part of my medical decision making, I reviewed the following data within the [place] notes reviewed and incorporated, Labs reviewed  see above, Old chart reviewed, Radiograph reviewed chest x-ray is normal, Notes from prior ED visits and [place] Controlled Substance Database  ____________________________________________   FINAL CLINICAL IMPRESSION(S) / ED DIAGNOSES  Final diagnoses:  Suspected COVID-19 virus infection      NEW MEDICATIONS STARTED DURING THIS VISIT:  Discharge Medication List as of 06/15/2019  4:34 PM       Note:  This document was prepared using Dragon voice recognition software and may include unintentional dictation errors.    Versie Starks, PA-C 06/15/19 1721    Arta Silence, MD 06/15/19 1816

## 2019-06-15 NOTE — Discharge Instructions (Signed)
Return to the emergency department if worsening.  Follow with regular doctor if not better in 3 days.

## 2019-06-15 NOTE — ED Triage Notes (Signed)
Pt in via POV, reports fever, headache x a few days.  Drive through covid swab performed today.  NAD noted at this time.

## 2019-06-15 NOTE — ED Notes (Signed)
Pt reports covid symptoms. States they think they have covid. Was tested already today.

## 2019-06-17 ENCOUNTER — Other Ambulatory Visit: Payer: Self-pay

## 2019-06-17 ENCOUNTER — Emergency Department: Payer: Self-pay

## 2019-06-17 ENCOUNTER — Encounter: Payer: Self-pay | Admitting: Emergency Medicine

## 2019-06-17 DIAGNOSIS — I10 Essential (primary) hypertension: Secondary | ICD-10-CM | POA: Insufficient documentation

## 2019-06-17 DIAGNOSIS — Z87891 Personal history of nicotine dependence: Secondary | ICD-10-CM | POA: Insufficient documentation

## 2019-06-17 DIAGNOSIS — U071 COVID-19: Secondary | ICD-10-CM | POA: Insufficient documentation

## 2019-06-17 LAB — NOVEL CORONAVIRUS, NAA: SARS-CoV-2, NAA: DETECTED — AB

## 2019-06-17 MED ORDER — ACETAMINOPHEN 325 MG PO TABS
650.0000 mg | ORAL_TABLET | Freq: Once | ORAL | Status: AC | PRN
  Administered 2019-06-17: 650 mg via ORAL
  Filled 2019-06-17: qty 2

## 2019-06-17 NOTE — ED Triage Notes (Signed)
Fever and cough for the past few days, Covid +, here mainly for cough control. Denies shob at this time. Has had symptoms for 9 days now. Talking in complete sentences.

## 2019-06-18 ENCOUNTER — Emergency Department: Payer: Self-pay

## 2019-06-18 ENCOUNTER — Other Ambulatory Visit: Payer: Self-pay

## 2019-06-18 ENCOUNTER — Emergency Department
Admission: EM | Admit: 2019-06-18 | Discharge: 2019-06-18 | Disposition: A | Discharge status: Home / Self Care | Payer: Self-pay | Attending: Emergency Medicine | Admitting: Emergency Medicine

## 2019-06-18 DIAGNOSIS — R05 Cough: Secondary | ICD-10-CM

## 2019-06-18 DIAGNOSIS — U071 COVID-19: Secondary | ICD-10-CM

## 2019-06-18 DIAGNOSIS — R059 Cough, unspecified: Secondary | ICD-10-CM

## 2019-06-18 LAB — CBC WITH DIFFERENTIAL/PLATELET
Abs Immature Granulocytes: 0.03 10*3/uL (ref 0.00–0.07)
Basophils Absolute: 0 10*3/uL (ref 0.0–0.1)
Basophils Relative: 0 %
Eosinophils Absolute: 0 10*3/uL (ref 0.0–0.5)
Eosinophils Relative: 0 %
HCT: 41.8 % (ref 39.0–52.0)
Hemoglobin: 13.7 g/dL (ref 13.0–17.0)
Immature Granulocytes: 1 %
Lymphocytes Relative: 15 %
Lymphs Abs: 0.8 10*3/uL (ref 0.7–4.0)
MCH: 28.5 pg (ref 26.0–34.0)
MCHC: 32.8 g/dL (ref 30.0–36.0)
MCV: 87.1 fL (ref 80.0–100.0)
Monocytes Absolute: 0.4 10*3/uL (ref 0.1–1.0)
Monocytes Relative: 7 %
Neutro Abs: 4 10*3/uL (ref 1.7–7.7)
Neutrophils Relative %: 77 %
Platelets: 212 10*3/uL (ref 150–400)
RBC: 4.8 MIL/uL (ref 4.22–5.81)
RDW: 13 % (ref 11.5–15.5)
WBC: 5.2 10*3/uL (ref 4.0–10.5)
nRBC: 0 % (ref 0.0–0.2)

## 2019-06-18 LAB — COMPREHENSIVE METABOLIC PANEL
ALT: 83 U/L — ABNORMAL HIGH (ref 0–44)
AST: 61 U/L — ABNORMAL HIGH (ref 15–41)
Albumin: 3.5 g/dL (ref 3.5–5.0)
Alkaline Phosphatase: 70 U/L (ref 38–126)
Anion gap: 8 (ref 5–15)
BUN: 9 mg/dL (ref 6–20)
CO2: 25 mmol/L (ref 22–32)
Calcium: 8.1 mg/dL — ABNORMAL LOW (ref 8.9–10.3)
Chloride: 101 mmol/L (ref 98–111)
Creatinine, Ser: 0.86 mg/dL (ref 0.61–1.24)
GFR calc Af Amer: >60 mL/min (ref >60)
GFR calc non Af Amer: >60 mL/min (ref >60)
Glucose, Bld: 105 mg/dL — ABNORMAL HIGH (ref 70–99)
Potassium: 3.8 mmol/L (ref 3.5–5.1)
Sodium: 134 mmol/L — ABNORMAL LOW (ref 135–145)
Total Bilirubin: 0.6 mg/dL (ref 0.3–1.2)
Total Protein: 7.2 g/dL (ref 6.5–8.1)

## 2019-06-18 LAB — TROPONIN I (HIGH SENSITIVITY): Troponin I (High Sensitivity): 6 ng/L (ref <18)

## 2019-06-18 MED ORDER — GUAIFENESIN-CODEINE 100-10 MG/5ML PO SOLN: 5.0000 mL | Freq: Four times a day (QID) | ORAL | 0 refills | Status: AC | PRN

## 2019-06-18 MED ORDER — BENZONATATE 100 MG PO CAPS: 100.0000 mg | ORAL_CAPSULE | Freq: Three times a day (TID) | ORAL | 0 refills | Status: DC | PRN

## 2019-06-18 MED ORDER — BENZONATATE 100 MG PO CAPS
100.0000 mg | ORAL_CAPSULE | Freq: Once | ORAL | Status: AC
  Administered 2019-06-18: 100 mg via ORAL
  Filled 2019-06-18: qty 1

## 2019-06-18 NOTE — ED Provider Notes (Signed)
[facility] Emergency Department Provider Note  ____________________________________________   First MD Initiated Contact with Patient 06/18/19 0116     (approximate)  I have reviewed the triage vital signs and the nursing notes.   HISTORY  Chief Complaint Fever and Cough    HPI Christopher Norton is a 53 y.o. male with hypertension comes in with coronavirus and cough.  Patient was seen on 11/3 for suspected Covid.  At that time he been having symptoms for 6 days.  Patient was noted to have a pulse ox of 94% on room air.  He had normal work-up except for elevated LFTs.  He had chest x-ray that was normal.  He was positive for coronavirus.  Patient says that now symptoms him go on for 9 days.  His biggest concern is the cough.  The cough is intermittent, severe, nothing makes better, nothing makes it worse.  He says it has been associate with some chest tightness as well.  Denies any shortness of breath.  Has been having fevers.  Denies any leg swelling.  Patient declined Spanish interpreter.   Past Medical History:  Diagnosis Date  . Hypertension     Patient Active Problem List   Diagnosis Date Noted  . Acute renal failure (ARF) (HCC) 03/07/2017    History reviewed. No pertinent surgical history.  Prior to Admission medications   Medication Sig Start Date End Date Taking? Authorizing Provider  acetaminophen (TYLENOL) 500 MG tablet Take 500 mg by mouth every 6 (six) hours as needed.    [provider]  diazepam (VALIUM) 2 MG tablet Take 1 tablet (2 mg total) by mouth every 8 (eight) hours as needed for muscle spasms. 04/29/18   Irean Hong, MD    Allergies Patient has no known allergies.  No family history on file.  Social History Social History   Tobacco Use  . Smoking status: Former Smoker    Types: Cigarettes    Quit date: 08/09/2016    Years since quitting: 2.8  . Smokeless tobacco: Never Used  Substance Use Topics   . Alcohol use: Yes    Alcohol/week: 12.0 standard drinks    Types: 12 Cans of beer per week  . Drug use: No      Review of Systems Constitutional: No fever/chills Eyes: No visual changes. ENT: No sore throat. Cardiovascular: Positive for chest tightness Respiratory: Positive for cough Gastrointestinal: No abdominal pain.  No nausea, no vomiting.  No diarrhea.  No constipation. Genitourinary: Negative for dysuria. Musculoskeletal: Negative for back pain. Skin: Negative for rash. Neurological: Negative for headaches, focal weakness or numbness. All other ROS negative ____________________________________________   PHYSICAL EXAM:  VITAL SIGNS: ED Triage Vitals  Enc Vitals Group     BP 06/17/19 2252 133/73     Pulse Rate 06/17/19 2252 (!) 102     Resp 06/17/19 2252 16     Temp 06/17/19 2252 (!) 102.7 F (39.3 C)     Temp src --      SpO2 06/17/19 2252 100 %     Weight 06/17/19 2253 242 lb (109.8 kg)     Height 06/17/19 2253 5\' 1"  (1.549 m)     Head Circumference --      Peak Flow --      Pain Score 06/17/19 2253 8     Pain Loc --      Pain Edu? --      Excl. in GC? --  Constitutional: Alert and oriented. Well appearing and in no acute distress. Eyes: Conjunctivae are normal. EOMI. Head: Atraumatic. Nose: No congestion/rhinnorhea. Mouth/Throat: Mucous membranes are moist.   Neck: No stridor. Trachea Midline. FROM Cardiovascular: Normal rate, regular rhythm. Grossly normal heart sounds.  Good peripheral circulation. Respiratory: No increased work of breathing, no stridor Gastrointestinal: Soft and nontender. No distention. No abdominal bruits.  Musculoskeletal: No lower extremity tenderness nor edema.  No joint effusions. Neurologic:  Normal speech and language. No gross focal neurologic deficits are appreciated.  Skin:  Skin is warm, dry and intact. No rash noted. Psychiatric: Mood and affect are normal. Speech and behavior are normal. GU: Deferred    ____________________________________________   LABS (all labs ordered are listed, but only abnormal results are displayed)  Labs Reviewed  COMPREHENSIVE METABOLIC PANEL - Abnormal; Notable for the following components:      Result Value   Sodium 134 (*)    Glucose, Bld 105 (*)    Calcium 8.1 (*)    AST 61 (*)    ALT 83 (*)    All other components within normal limits  CBC WITH DIFFERENTIAL/PLATELET  TROPONIN I (HIGH SENSITIVITY)  TROPONIN I (HIGH SENSITIVITY)   ____________________________________________   ED ECG REPORT I, Concha SeMary E Carlota Philley, the attending physician, personally viewed and interpreted this ECG.  EKG is sinus rate of 92, no ST elevation, no T wave inversions, normal intervals. ____________________________________________  RADIOLOGY Vela ProseI, Philicia Heyne E Shelsy Seng, personally viewed and evaluated these images (plain radiographs) as part of my medical decision making, as well as reviewing the written report by the radiologist.  ED MD interpretation: No pleural effusions or lobar pneumonia.  Changes most consistent with coronavirus.  Official radiology report(s): Dg Chest Port 1 View  Result Date: 06/18/2019 CLINICAL DATA:  Cough, fever, COVID positive EXAM: PORTABLE CHEST 1 VIEW COMPARISON:  06/15/2019 FINDINGS: Patchy peripheral airspace disease best seen in the right lung, particularly right upper lobe. Heart is normal size. No effusions. No acute bony abnormality. IMPRESSION: Patchy airspace disease, best seen peripherally in the right upper lobe. Electronically Signed   By: Charlett NoseKevin  Dover M.D.   On: 06/18/2019 01:27    ____________________________________________   PROCEDURES  Procedure(s) performed (including Critical Care):  Procedures   ____________________________________________   INITIAL IMPRESSION / ASSESSMENT AND PLAN / ED COURSE   [name] was evaluated in Emergency Department on 06/18/2019 for the symptoms described in the history of present  illness. He was evaluated in the context of the global COVID-19 pandemic, which necessitated consideration that the patient might be at risk for infection with the SARS-CoV-2 virus that causes COVID-19. Institutional protocols and algorithms that pertain to the evaluation of patients at risk for COVID-19 are in a state of rapid change based on information released by regulatory bodies including the CDC and federal and state organizations. These policies and algorithms were followed during the patient's care in the ED.     Pt presents with cough.  This is most likely secondary to coronavirus.  We will send patient home on Tessalon Perles and cough syrup with codeine to use at nighttime.  Explained to the patient not to drive while using this.  PNA-will get xray to evaluation Anemia-CBC to evaluate ACS- will get trops Arrhythmia-Will get EKG and keep on monitor.  COVID- will get testing per algorithm. PE-lower suspicion given no risk factors and other cause more likely   No evidence of anemia.  LFTs are downtrending from 3 days ago.  Troponin is 6 which is down from 10 3 days ago.  Given my very low suspicion for this being ACS will hold off on repeat troponin.  Patient feels comfortable with that plan.  Given symptomatic treatment for his cough.  Patient amatory sat was normal.  Patient was comfortable with discharge home.  Patient understands the quarantine process  ____________________________________________   FINAL CLINICAL IMPRESSION(S) / ED DIAGNOSES   Final diagnoses:  COVID-19     MEDICATIONS GIVEN DURING THIS VISIT:  Medications  acetaminophen (TYLENOL) tablet 650 mg (650 mg Oral Given 06/17/19 2258)  benzonatate (TESSALON) capsule 100 mg (100 mg Oral Given 06/18/19 0200)     ED Discharge Orders         Ordered    benzonatate (TESSALON PERLES) 100 MG capsule  3 times daily PRN     06/18/19 0129    guaiFENesin-codeine 100-10 MG/5ML syrup  Every 6 hours PRN     06/18/19  0129           Note:  This document was prepared using Dragon voice recognition software and may include unintentional dictation errors.   Vanessa Emerald, MD 06/18/19 [phone]

## 2019-06-18 NOTE — Discharge Instructions (Addendum)
You are positive for coronavirus.  You can take 1 g of Tylenol every 8 hours to help with your fever.  We have also prescribed you Tessalon Perles to help with your cough.  The guaifenesin and codeine you should use at night to start because it does have an opioid in it.  You should not drive while using this.  Your liver enzymes were slightly elevated but better than 3 days ago.  Follows up with your primary care doctor.  Remain quarantine at home until you are asymptomatic for 3 days.

## 2019-06-27 ENCOUNTER — Encounter: Payer: Self-pay | Admitting: Emergency Medicine

## 2019-06-27 ENCOUNTER — Emergency Department
Admission: EM | Admit: 2019-06-27 | Discharge: 2019-06-27 | Disposition: A | Discharge status: Home / Self Care | Payer: Self-pay | Attending: Emergency Medicine | Admitting: Emergency Medicine

## 2019-06-27 ENCOUNTER — Other Ambulatory Visit: Payer: Self-pay

## 2019-06-27 DIAGNOSIS — M545 Low back pain, unspecified: Secondary | ICD-10-CM

## 2019-06-27 DIAGNOSIS — U071 COVID-19: Secondary | ICD-10-CM | POA: Insufficient documentation

## 2019-06-27 DIAGNOSIS — Z87891 Personal history of nicotine dependence: Secondary | ICD-10-CM | POA: Insufficient documentation

## 2019-06-27 DIAGNOSIS — R05 Cough: Secondary | ICD-10-CM | POA: Insufficient documentation

## 2019-06-27 DIAGNOSIS — I10 Essential (primary) hypertension: Secondary | ICD-10-CM | POA: Insufficient documentation

## 2019-06-27 DIAGNOSIS — R531 Weakness: Secondary | ICD-10-CM | POA: Insufficient documentation

## 2019-06-27 MED ORDER — LIDOCAINE 5 % EX PTCH: 1.0000 | MEDICATED_PATCH | CUTANEOUS | 0 refills | Status: DC

## 2019-06-27 MED ORDER — OXYCODONE-ACETAMINOPHEN 5-325 MG PO TABS: 1.0000 | ORAL_TABLET | Freq: Three times a day (TID) | ORAL | 0 refills | Status: AC | PRN

## 2019-06-27 MED ORDER — MELOXICAM 7.5 MG PO TABS: 7.5000 mg | ORAL_TABLET | Freq: Every day | ORAL | 0 refills | Status: DC

## 2019-06-27 MED ORDER — CYCLOBENZAPRINE HCL 5 MG PO TABS: ORAL_TABLET | ORAL | 0 refills | Status: DC

## 2019-06-27 NOTE — ED Notes (Signed)
See triage note   Presents with lower back pain  States pain started yesterday  No injury  Ambulates well

## 2019-06-27 NOTE — ED Provider Notes (Signed)
[facility] Emergency Department Provider Note  ____________________________________________  Time seen: Approximately 7:35 AM  I have reviewed the triage vital signs and the nursing notes.   HISTORY  Chief Complaint Back Pain    HPI  Christopher Norton is a 53 y.o. male that presents to emergency department for evaluation of low back pain for a couple of days.  Pain does not radiate.  Patient told to triage that his legs feel weak but clarifies that this is only because of the pain to his back that makes it difficult to walk.  He has good strength in his legs.  No numbness or tingling.  No bowel or bladder dysfunction or saddle anesthesias.  Patient tested positive for COVID-19 12 days prior.  Patient states that he thinks that because he was in the bed a lot and shaking due to the fever, that his back pain has flared up.  Patient states that his Covid symptoms have almost completely resolved.  He only has an occasional cough now.  His fever is gone.  No shortness of breath.  He had a similar episode of back pain 1 year ago and was given Percocet, which resolved symptoms after 2 days. No trauma.   Past Medical History:  Diagnosis Date  . Hypertension     Patient Active Problem List   Diagnosis Date Noted  . Acute renal failure (ARF) ([place]) 03/07/2017    History reviewed. No pertinent surgical history.  Prior to Admission medications   Medication Sig Start Date End Date Taking? Authorizing Provider  acetaminophen (TYLENOL) 500 MG tablet Take 500 mg by mouth every 6 (six) hours as needed.    [provider]  benzonatate (TESSALON PERLES) 100 MG capsule Take 1 capsule (100 mg total) by mouth 3 (three) times daily as needed for cough. 06/18/19 06/17/20  Vanessa , MD  cyclobenzaprine (FLEXERIL) 5 MG tablet Take 1-2 tablets 3 times daily as needed 06/27/19   Laban Emperor, PA-C  diazepam (VALIUM) 2 MG tablet Take 1 tablet (2 mg total) by mouth  every 8 (eight) hours as needed for muscle spasms. 04/29/18   Paulette Blanch, MD  lidocaine (LIDODERM) 5 % Place 1 patch onto the skin daily. Remove & Discard patch within 12 hours or as directed by MD 06/27/19   Laban Emperor, PA-C  meloxicam (MOBIC) 7.5 MG tablet Take 1 tablet (7.5 mg total) by mouth daily. 06/27/19 06/26/20  Laban Emperor, PA-C  oxyCODONE-acetaminophen (PERCOCET) 5-325 MG tablet Take 1 tablet by mouth every 8 (eight) hours as needed for up to 2 days for severe pain. 06/27/19 06/29/19  Laban Emperor, PA-C    Allergies Patient has no known allergies.  History reviewed. No pertinent family history.  Social History Social History   Tobacco Use  . Smoking status: Former Smoker    Types: Cigarettes    Quit date: 08/09/2016    Years since quitting: 2.8  . Smokeless tobacco: Never Used  Substance Use Topics  . Alcohol use: Yes    Alcohol/week: 12.0 standard drinks    Types: 12 Cans of beer per week  . Drug use: No     Review of Systems  Cardiovascular: No chest pain. Respiratory: No SOB. Gastrointestinal: No abdominal pain.  No nausea, no vomiting.  Musculoskeletal: Positive for back pain. Skin: Negative for rash, abrasions, lacerations, ecchymosis. Neurological: Negative for headaches   ____________________________________________   PHYSICAL EXAM:  VITAL SIGNS: ED Triage Vitals  Enc Vitals Group  BP 06/27/19 0631 (!) 148/98     Pulse Rate 06/27/19 0631 89     Resp 06/27/19 0631 16     Temp 06/27/19 0631 99.1 F (37.3 C)     Temp Source 06/27/19 0631 Oral     SpO2 06/27/19 0631 97 %     Weight 06/27/19 0628 246 lb (111.6 kg)     Height 06/27/19 0628 5\' 6"  (1.676 m)     Head Circumference --      Peak Flow --      Pain Score 06/27/19 0626 8     Pain Loc --      Pain Edu? --      Excl. in GC? --      Constitutional: Alert and oriented. Well appearing and in no acute distress. Eyes: Conjunctivae are normal. PERRL. EOMI. Head:  Atraumatic. ENT:      Ears:      Nose: No congestion/rhinnorhea.      Mouth/Throat: Mucous membranes are moist.  Neck: No stridor. Cardiovascular: Normal rate, regular rhythm.  Good peripheral circulation. Respiratory: Normal respiratory effort without tachypnea or retractions. Lungs CTAB. Good air entry to the bases with no decreased or absent breath sounds. Gastrointestinal: Bowel sounds 4 quadrants. Soft and nontender to palpation. No guarding or rigidity. No palpable masses. No distention. No CVA tenderness. Musculoskeletal: Full range of motion to all extremities. No gross deformities appreciated.  Mild tenderness to palpation to lumbar spine.  Strength equal in lower extremities bilaterally.  Normal but slow gait. Neurologic:  Normal speech and language. No gross focal neurologic deficits are appreciated.  Skin:  Skin is warm, dry and intact. No rash noted. Psychiatric: Mood and affect are normal. Speech and behavior are normal. Patient exhibits appropriate insight and judgement.   ____________________________________________   LABS (all labs ordered are listed, but only abnormal results are displayed)  Labs Reviewed - No data to display ____________________________________________  EKG   ____________________________________________  RADIOLOGY   No results found.  ____________________________________________    PROCEDURES  Procedure(s) performed:    Procedures    Medications - No data to display   ____________________________________________   INITIAL IMPRESSION / ASSESSMENT AND PLAN / ED COURSE  Pertinent labs & imaging results that were available during my care of the patient were reviewed by me and considered in my medical decision making (see chart for details).  Review of the [place] CSRS was performed in accordance of the [facility] prior to dispensing any controlled drugs.     Patient presented to emergency department for evaluation of low back pain.   Vital signs and exam are reassuring.  Patient will be discharged home with prescriptions for Flexeril, a low dose and short course of Mobic, Lidoderm, short course of Percocet. Patient is to follow up with ortho as directed.  Referral was given to Dr. Yves Dillhasnis.  Patient is given ED precautions to return to the ED for any worsening or new symptoms.  Aarib Patric DykesG Quijano Parades was evaluated in Emergency Department on 06/27/2019 for the symptoms described in the history of present illness. He was evaluated in the context of the global COVID-19 pandemic, which necessitated consideration that the patient might be at risk for infection with the SARS-CoV-2 virus that causes COVID-19. Institutional protocols and algorithms that pertain to the evaluation of patients at risk for COVID-19 are in a state of rapid change based on information released by regulatory bodies including the CDC and federal and state organizations. These policies and algorithms were  followed during the patient's care in the ED.   ____________________________________________  FINAL CLINICAL IMPRESSION(S) / ED DIAGNOSES  Final diagnoses:  Acute midline low back pain without sciatica      NEW MEDICATIONS STARTED DURING THIS VISIT:  ED Discharge Orders         Ordered    oxyCODONE-acetaminophen (PERCOCET) 5-325 MG tablet  Every 8 hours PRN     06/27/19 0743    cyclobenzaprine (FLEXERIL) 5 MG tablet     06/27/19 0743    meloxicam (MOBIC) 7.5 MG tablet  Daily     06/27/19 0743    lidocaine (LIDODERM) 5 %  Every 24 hours     06/27/19 0743              This chart was dictated using voice recognition software/Dragon. Despite best efforts to proofread, errors can occur which can change the meaning. Any change was purely unintentional.    Enid Derry, PA-C 06/27/19 1424    Emily Filbert, MD 06/27/19 [phone]

## 2019-06-27 NOTE — ED Triage Notes (Addendum)
Pt presents this am with c/o lower back pain since yesterday morning; over the counter medications not helping; pt is COVID postive; pt says pain does not radiate down his leg but his legs feel weak

## 2019-11-08 ENCOUNTER — Ambulatory Visit: Payer: Self-pay

## 2019-11-08 ENCOUNTER — Other Ambulatory Visit: Payer: Self-pay

## 2019-11-08 ENCOUNTER — Ambulatory Visit: Payer: Self-pay | Attending: Internal Medicine

## 2019-11-08 DIAGNOSIS — Z23 Encounter for immunization: Secondary | ICD-10-CM

## 2019-11-08 NOTE — Progress Notes (Signed)
   Covid-19 Vaccination Clinic  Name:  Rafel Garde    MRN: [id number] DOB: 10/11/1965  11/08/2019  Mr. Karrie Meres was observed post Covid-19 immunization for 15 minutes without incident. He was provided with Vaccine Information Sheet and instruction to access the V-Safe system.   Mr. Karrie Meres was instructed to call 911 with any severe reactions post vaccine: Marland Kitchen Difficulty breathing  . Swelling of face and throat  . A fast heartbeat  . A bad rash all over body  . Dizziness and weakness   Immunizations Administered    Name Date Dose VIS Date Route   Pfizer COVID-19 Vaccine 11/08/2019 10:36 AM 0.3 mL 07/23/2019 Intramuscular   Manufacturer: [employer], [employer]   Lot: [id number]   NDC: [address]-1

## 2019-11-29 ENCOUNTER — Ambulatory Visit: Payer: Self-pay

## 2019-12-07 ENCOUNTER — Ambulatory Visit: Payer: Self-pay

## 2019-12-08 ENCOUNTER — Ambulatory Visit: Payer: Self-pay | Attending: Internal Medicine

## 2019-12-08 DIAGNOSIS — Z23 Encounter for immunization: Secondary | ICD-10-CM

## 2020-02-23 ENCOUNTER — Inpatient Hospital Stay
Admission: EM | Admit: 2020-02-23 | Discharge: 2020-02-26 | DRG: 440 | Disposition: A | Discharge status: Home / Self Care | Payer: Self-pay | Attending: Internal Medicine | Admitting: Internal Medicine

## 2020-02-23 ENCOUNTER — Other Ambulatory Visit: Payer: Self-pay

## 2020-02-23 ENCOUNTER — Emergency Department: Payer: Self-pay

## 2020-02-23 ENCOUNTER — Inpatient Hospital Stay: Payer: Self-pay

## 2020-02-23 DIAGNOSIS — Z87891 Personal history of nicotine dependence: Secondary | ICD-10-CM

## 2020-02-23 DIAGNOSIS — F1010 Alcohol abuse, uncomplicated: Secondary | ICD-10-CM | POA: Diagnosis present

## 2020-02-23 DIAGNOSIS — R140 Abdominal distension (gaseous): Secondary | ICD-10-CM | POA: Diagnosis present

## 2020-02-23 DIAGNOSIS — Z20822 Contact with and (suspected) exposure to covid-19: Secondary | ICD-10-CM | POA: Diagnosis present

## 2020-02-23 DIAGNOSIS — I10 Essential (primary) hypertension: Secondary | ICD-10-CM | POA: Diagnosis present

## 2020-02-23 DIAGNOSIS — R1032 Left lower quadrant pain: Secondary | ICD-10-CM

## 2020-02-23 DIAGNOSIS — Z8249 Family history of ischemic heart disease and other diseases of the circulatory system: Secondary | ICD-10-CM

## 2020-02-23 DIAGNOSIS — K5900 Constipation, unspecified: Secondary | ICD-10-CM | POA: Diagnosis present

## 2020-02-23 DIAGNOSIS — K760 Fatty (change of) liver, not elsewhere classified: Secondary | ICD-10-CM | POA: Diagnosis present

## 2020-02-23 DIAGNOSIS — L409 Psoriasis, unspecified: Secondary | ICD-10-CM | POA: Diagnosis present

## 2020-02-23 DIAGNOSIS — K852 Alcohol induced acute pancreatitis without necrosis or infection: Principal | ICD-10-CM

## 2020-02-23 DIAGNOSIS — K7010 Alcoholic hepatitis without ascites: Secondary | ICD-10-CM

## 2020-02-23 DIAGNOSIS — Z79899 Other long term (current) drug therapy: Secondary | ICD-10-CM

## 2020-02-23 DIAGNOSIS — R109 Unspecified abdominal pain: Secondary | ICD-10-CM | POA: Diagnosis present

## 2020-02-23 HISTORY — DX: Unspecified abdominal pain: R10.9

## 2020-02-23 HISTORY — DX: Alcohol abuse, uncomplicated: F10.10

## 2020-02-23 HISTORY — DX: Psoriasis, unspecified: L40.9

## 2020-02-23 LAB — CBC
HCT: 53.3 % — ABNORMAL HIGH (ref 39.0–52.0)
HCT: 51.4 % (ref 39.0–52.0)
Hemoglobin: 18.8 g/dL — ABNORMAL HIGH (ref 13.0–17.0)
Hemoglobin: 17.8 g/dL — ABNORMAL HIGH (ref 13.0–17.0)
MCH: 28.7 pg (ref 26.0–34.0)
MCH: 28.4 pg (ref 26.0–34.0)
MCHC: 35.3 g/dL (ref 30.0–36.0)
MCHC: 34.6 g/dL (ref 30.0–36.0)
MCV: 81.3 fL (ref 80.0–100.0)
MCV: 82.1 fL (ref 80.0–100.0)
Platelets: 322 10*3/uL (ref 150–400)
Platelets: 331 10*3/uL (ref 150–400)
RBC: 6.56 MIL/uL — ABNORMAL HIGH (ref 4.22–5.81)
RBC: 6.26 MIL/uL — ABNORMAL HIGH (ref 4.22–5.81)
RDW: 13.3 % (ref 11.5–15.5)
RDW: 13.4 % (ref 11.5–15.5)
WBC: 10.7 10*3/uL — ABNORMAL HIGH (ref 4.0–10.5)
WBC: 17.9 10*3/uL — ABNORMAL HIGH (ref 4.0–10.5)
nRBC: 0 % (ref 0.0–0.2)
nRBC: 0 % (ref 0.0–0.2)

## 2020-02-23 LAB — COMPREHENSIVE METABOLIC PANEL
ALT: 557 U/L — ABNORMAL HIGH (ref 0–44)
AST: 858 U/L — ABNORMAL HIGH (ref 15–41)
Albumin: 3.7 g/dL (ref 3.5–5.0)
Alkaline Phosphatase: 254 U/L — ABNORMAL HIGH (ref 38–126)
Anion gap: 13 (ref 5–15)
BUN: 12 mg/dL (ref 6–20)
CO2: 23 mmol/L (ref 22–32)
Calcium: 8.2 mg/dL — ABNORMAL LOW (ref 8.9–10.3)
Chloride: 98 mmol/L (ref 98–111)
Creatinine, Ser: 0.73 mg/dL (ref 0.61–1.24)
GFR calc Af Amer: >60 mL/min (ref >60)
GFR calc non Af Amer: >60 mL/min (ref >60)
Glucose, Bld: 137 mg/dL — ABNORMAL HIGH (ref 70–99)
Potassium: 3.9 mmol/L (ref 3.5–5.1)
Sodium: 134 mmol/L — ABNORMAL LOW (ref 135–145)
Total Bilirubin: 2.4 mg/dL — ABNORMAL HIGH (ref 0.3–1.2)
Total Protein: 7.3 g/dL (ref 6.5–8.1)

## 2020-02-23 LAB — URINALYSIS, COMPLETE (UACMP) WITH MICROSCOPIC
Bilirubin Urine: NEGATIVE
Glucose, UA: NEGATIVE mg/dL
Ketones, ur: NEGATIVE mg/dL
Leukocytes,Ua: NEGATIVE
Nitrite: NEGATIVE
Protein, ur: 30 mg/dL — AB
Specific Gravity, Urine: 1.02 (ref 1.005–1.030)
pH: 7 (ref 5.0–8.0)

## 2020-02-23 LAB — URINE DRUG SCREEN, QUALITATIVE (ARMC ONLY)
Amphetamines, Ur Screen: NOT DETECTED
Barbiturates, Ur Screen: NOT DETECTED
Benzodiazepine, Ur Scrn: NOT DETECTED
Cannabinoid 50 Ng, Ur ~~LOC~~: NOT DETECTED
Cocaine Metabolite,Ur ~~LOC~~: NOT DETECTED
MDMA (Ecstasy)Ur Screen: NOT DETECTED
Methadone Scn, Ur: NOT DETECTED
Opiate, Ur Screen: NOT DETECTED
Phencyclidine (PCP) Ur S: NOT DETECTED
Tricyclic, Ur Screen: NOT DETECTED

## 2020-02-23 LAB — APTT: aPTT: 30 seconds (ref 24–36)

## 2020-02-23 LAB — LIPASE, BLOOD: Lipase: 63 U/L — ABNORMAL HIGH (ref 11–51)

## 2020-02-23 LAB — ETHANOL: Alcohol, Ethyl (B): 66 mg/dL — ABNORMAL HIGH (ref <10)

## 2020-02-23 LAB — PROTIME-INR
INR: 1.1 (ref 0.8–1.2)
Prothrombin Time: 14.1 seconds (ref 11.4–15.2)

## 2020-02-23 LAB — SARS CORONAVIRUS 2 BY RT PCR (HOSPITAL ORDER, PERFORMED IN ~~LOC~~ HOSPITAL LAB): SARS Coronavirus 2: NEGATIVE

## 2020-02-23 MED ORDER — LORAZEPAM 2 MG/ML IJ SOLN: 1.0000 mg | INTRAMUSCULAR | Status: DC | PRN

## 2020-02-23 MED ORDER — CHLORDIAZEPOXIDE HCL 5 MG PO CAPS
5.0000 mg | ORAL_CAPSULE | Freq: Four times a day (QID) | ORAL | Status: AC
  Administered 2020-02-23 – 2020-02-25 (×8): 5 mg via ORAL
  Filled 2020-02-23 (×8): qty 1

## 2020-02-23 MED ORDER — HEPARIN SODIUM (PORCINE) 5000 UNIT/ML IJ SOLN
5000.0000 [IU] | Freq: Three times a day (TID) | INTRAMUSCULAR | Status: DC
  Administered 2020-02-23 – 2020-02-24 (×3): 5000 [IU] via SUBCUTANEOUS
  Filled 2020-02-23 (×3): qty 1

## 2020-02-23 MED ORDER — LIDOCAINE VISCOUS HCL 2 % MT SOLN
15.0000 mL | Freq: Once | OROMUCOSAL | Status: AC
  Administered 2020-02-23: 15 mL via OROMUCOSAL
  Filled 2020-02-23: qty 15

## 2020-02-23 MED ORDER — IOHEXOL 300 MG/ML  SOLN
100.0000 mL | Freq: Once | INTRAMUSCULAR | Status: AC | PRN
  Administered 2020-02-23: 100 mL via INTRAVENOUS
  Filled 2020-02-23: qty 100

## 2020-02-23 MED ORDER — DICYCLOMINE HCL 10 MG PO CAPS
20.0000 mg | ORAL_CAPSULE | Freq: Once | ORAL | Status: AC
  Administered 2020-02-23: 20 mg via ORAL
  Filled 2020-02-23: qty 2

## 2020-02-23 MED ORDER — LORAZEPAM 1 MG PO TABS
1.0000 mg | ORAL_TABLET | ORAL | Status: DC | PRN
  Administered 2020-02-23: 1 mg via ORAL
  Filled 2020-02-23: qty 1

## 2020-02-23 MED ORDER — HYDRALAZINE HCL 10 MG PO TABS
10.0000 mg | ORAL_TABLET | Freq: Four times a day (QID) | ORAL | Status: DC
  Filled 2020-02-23 (×4): qty 1

## 2020-02-23 MED ORDER — THIAMINE HCL 100 MG PO TABS
100.0000 mg | ORAL_TABLET | Freq: Every day | ORAL | Status: DC
  Administered 2020-02-23 – 2020-02-26 (×4): 100 mg via ORAL
  Filled 2020-02-23 (×4): qty 1

## 2020-02-23 MED ORDER — ADULT MULTIVITAMIN W/MINERALS CH
1.0000 | ORAL_TABLET | Freq: Every day | ORAL | Status: DC
  Administered 2020-02-24 – 2020-02-26 (×3): 1 via ORAL
  Filled 2020-02-23 (×3): qty 1

## 2020-02-23 MED ORDER — HYDRALAZINE HCL 20 MG/ML IJ SOLN
10.0000 mg | Freq: Once | INTRAMUSCULAR | Status: AC
  Administered 2020-02-23: 10 mg via INTRAVENOUS
  Filled 2020-02-23: qty 1

## 2020-02-23 MED ORDER — LISINOPRIL 10 MG PO TABS
10.0000 mg | ORAL_TABLET | Freq: Once | ORAL | Status: AC
  Administered 2020-02-23: 10 mg via ORAL
  Filled 2020-02-23: qty 1

## 2020-02-23 MED ORDER — THIAMINE HCL 100 MG/ML IJ SOLN: 100.0000 mg | Freq: Every day | INTRAMUSCULAR | Status: DC

## 2020-02-23 MED ORDER — IOHEXOL 9 MG/ML PO SOLN
500.0000 mL | ORAL | Status: AC
  Administered 2020-02-23 (×2): 500 mL via ORAL
  Filled 2020-02-23 (×2): qty 500

## 2020-02-23 MED ORDER — FOLIC ACID 1 MG PO TABS
1.0000 mg | ORAL_TABLET | Freq: Every day | ORAL | Status: DC
  Administered 2020-02-23 – 2020-02-26 (×4): 1 mg via ORAL
  Filled 2020-02-23 (×4): qty 1

## 2020-02-23 MED ORDER — MORPHINE SULFATE (PF) 2 MG/ML IV SOLN
2.0000 mg | INTRAVENOUS | Status: DC | PRN
  Administered 2020-02-23: 2 mg via INTRAVENOUS
  Filled 2020-02-23 (×2): qty 1

## 2020-02-23 NOTE — H&P (Signed)
History and Physical    Christopher Norton JKK:[id number] DOB: Apr 25, 1966 DOA: 02/23/2020   PCP: Patient, No Pcp Per   Outpatient Specialists: None  Patient coming from: Home  Chief Complaint:  Abdominal pain.  HPI: Christopher Norton is a 54 y.o. male with medical history significant of Alcohol abuse seen in ed for abdominal pain that started 3 days ago and has progressively gotten worse. Pt states  His abd pain is left side of abdomen.  has had psoriasis for 9 years, does not have dermatologist. The pain is off and on and is consistent now. Pt has been taking some medicine that has been helping, medicine like honey.pt has been drinking since age of 61 and States he wants to quit drinking.pt lives with wife. Wife is leaving him because of his alcohol Abuse.Pt has nausea and vomiting , no blood , vomiting unrelated to food and has not been able to keep any food down in hi stomach. Pain is in left side of abdomen Pt used to smoke and has quit for many decades ago. Pain is worse with movement. Last alcohol drink was yesterday and he reports shakiness and tremors if he does not drink.  ED Course:  Pt is alert and gives history. Pt has has liver usg which shows fatty liver. Labs shows elevated ast/alt  And T.bili.   Review of Systems: As per HPI otherwise 10 point review of systems negative.   Past Medical History:  Diagnosis Date  . Abdominal pain   . Alcohol abuse   . Hypertension   . Psoriasis    History reviewed. No pertinent surgical history.   reports that he quit smoking about 3 years ago. His smoking use included cigarettes. He has never used smokeless tobacco. He reports current alcohol use of about 12.0 standard drinks of alcohol per week. He reports that he does not use drugs.  No Known Allergies  Family History  Problem Relation Age of Onset  . Hypertension Father   . Hypertension Sister   . Hypertension Brother      Prior to Admission  medications   Medication Sig Start Date End Date Taking? Authorizing Provider  acetaminophen (TYLENOL) 500 MG tablet Take 500 mg by mouth every 6 (six) hours as needed.    [provider]  benzonatate (TESSALON PERLES) 100 MG capsule Take 1 capsule (100 mg total) by mouth 3 (three) times daily as needed for cough. 06/18/19 06/17/20  Concha Se, MD  cyclobenzaprine (FLEXERIL) 5 MG tablet Take 1-2 tablets 3 times daily as needed 06/27/19   Enid Derry, PA-C  diazepam (VALIUM) 2 MG tablet Take 1 tablet (2 mg total) by mouth every 8 (eight) hours as needed for muscle spasms. 04/29/18   Irean Hong, MD  lidocaine (LIDODERM) 5 % Place 1 patch onto the skin daily. Remove & Discard patch within 12 hours or as directed by MD 06/27/19   Enid Derry, PA-C  meloxicam (MOBIC) 7.5 MG tablet Take 1 tablet (7.5 mg total) by mouth daily. 06/27/19 06/26/20  Enid Derry, PA-C    Physical Exam: Vitals:   02/23/20 1830 02/23/20 1834 02/23/20 1900 02/23/20 2005  BP: (!) 166/120 (!) 166/120 (!) 175/113 (!) 183/110  Pulse: (!) 101  99 (!) 110  Resp:      Temp:      TempSrc:      SpO2: 95%  94%   Weight:      Height:  Constitutional: NAD, calm, comfortable Vitals:   02/23/20 1830 02/23/20 1834 02/23/20 1900 02/23/20 2005  BP: (!) 166/120 (!) 166/120 (!) 175/113 (!) 183/110  Pulse: (!) 101  99 (!) 110  Resp:      Temp:      TempSrc:      SpO2: 95%  94%   Weight:      Height:       Eyes: PERRL, lids and conjunctivae normal ENMT: Mucous membranes are moist. Posterior pharynx clear of any exudate or lesions.Normal dentition.  Neck: normal, supple, no masses, no thyromegaly Respiratory: clear to auscultation bilaterally, no wheezing, no crackles. Normal respiratory effort. No accessory muscle use.  Cardiovascular: Regular rate and rhythm, no murmurs / rubs / gallops. No extremity edema. 2+ pedal pulses. No carotid bruits.  Abdomen: no tenderness, no masses palpated. No  hepatosplenomegaly. Bowel sounds positive.  Musculoskeletal: no clubbing / cyanosis. No joint deformity upper and lower extremities. Good ROM, no contractures. Normal muscle tone.  Skin:  Extensive psoriatic rash all over abdomen/ ext and back. Neurologic: CN 2-12 grossly intact.xstrength is wnl  Psychiatric: Normal judgment and insight. Alert and oriented x 3. Normal mood.   Labs on Admission: I have personally reviewed following labs and imaging studies  CBC: Recent Labs  Lab 02/23/20 1443  WBC 10.7*  HGB 18.8*  HCT 53.3*  MCV 81.3  PLT 322   Basic Metabolic Panel: Recent Labs  Lab 02/23/20 1443  NA 134*  K 3.9  CL 98  CO2 23  GLUCOSE 137*  BUN 12  CREATININE 0.73  CALCIUM 8.2*   GFR: Estimated Creatinine Clearance: 128.4 mL/min (by C-G formula based on SCr of 0.73 mg/dL). Liver Function Tests: Recent Labs  Lab 02/23/20 1443  AST 858*  ALT 557*  ALKPHOS 254*  BILITOT 2.4*  PROT 7.3  ALBUMIN 3.7   Recent Labs  Lab 02/23/20 1443  LIPASE 63*   No results for input(s): AMMONIA in the last 168 hours. Coagulation Profile: Recent Labs  Lab 02/23/20 1836  INR 1.1   Urine analysis:    Component Value Date/Time   COLORURINE YELLOW (A) 02/23/2020 1443   APPEARANCEUR CLEAR (A) 02/23/2020 1443   LABSPEC 1.020 02/23/2020 1443   PHURINE 7.0 02/23/2020 1443   GLUCOSEU NEGATIVE 02/23/2020 1443   HGBUR SMALL (A) 02/23/2020 1443   BILIRUBINUR NEGATIVE 02/23/2020 1443   KETONESUR NEGATIVE 02/23/2020 1443   PROTEINUR 30 (A) 02/23/2020 1443   NITRITE NEGATIVE 02/23/2020 1443   LEUKOCYTESUR NEGATIVE 02/23/2020 1443   Radiological Exams on Admission: US Abdomen Limited RUQ  Result Date: 02/23/2020 CLINICAL DATA:  Initial evaluation for acute abdominal pain for 3 days. EXAM: ULTRASOUND ABDOMEN LIMITED RIGHT UPPER QUADRANT COMPARISON:  None. FINDINGS: Gallbladder: No gallstones or wall thickening visualized. No sonographic Murphy sign noted by sonographer. Common  bile duct: Diameter: 3.4 mm Liver: No focal lesion identified. Liver demonstrates a diffusely dense echogenic echotexture. Portal vein is patent on color Doppler imaging with normal direction of blood flow towards the liver. Other: None. IMPRESSION: 1. Dense echogenic echotexture throughout the liver, which can be seen with steatosis and/or other intrinsic hepatocellular disease. Correlation with LFTs suggested. 2. Normal sonographic evaluation of the gallbladder. No biliary dilatation. Electronically Signed   By: Rise Mu M.D.   On: 02/23/2020 19:16    EKG: Independently reviewed.  Assessment/Plan Abdominal Pain: -admit to med-tele floor. -d/d include pancreatitis/ PUD/ Diverticulitis -obtain stat ct abd/pelvis.  - iv ppi.  Alcohol abuse: -CIWA precaution. -sch  librium 48 hours.  -seizure precaution.  -counseled pt about cessation.  Psoriasis: -d/w pt about seeking dermatologist care asap.  Fatty liver/ transaminitis: -d/w include fatty liver from alcohol or diabetes/ autoimmune as pt has psoriasis.  hepatitis panel. / ana -avoid tylenol.  HTN: -pt used to take lisinopril and of meds for several months.    DVT prophylaxis: Heparin Code Status: Full Family Communication: Marylene Land wife [phone]. Disposition Plan: Home Consults called: None Admission status: Inpatient.   Gertha Calkin MD [facility] If 7PM-7AM, please contact night-coverage [email or web address] Password [facility]  02/23/2020, 9:53 PM

## 2020-02-23 NOTE — ED Notes (Signed)
Pt states last drink was yesterday. Pt currently in NAD, respirations even and unlabored at this time. Pt is hypertensive at this time, but pt states he has a hx of this. Pt stating he wishes to seek help for drinking. Pt states he normally drinks 2 bottles of tequila a day.

## 2020-02-23 NOTE — ED Provider Notes (Signed)
[facility] Emergency Department Provider Note   ____________________________________________   I have reviewed the triage vital signs and the nursing notes.   HISTORY  Chief Complaint Abdominal Pain and Detox   History limited by: Not Limited   HPI Christopher Norton is a 54 y.o. male who presents to the emergency department today plaints of abdominal pain as well as desire to detox from alcohol. The pain is located primarily in the center abdomen.  Patient states he has had for acute and heavy user of tequila.  He states that starting a couple of days ago he developed abdominal pain.  Says been accompanied by nausea and vomiting.  He did try taking a laxative which not give any relief.  The patient states that he would like to stop using alcohol. He denies any fevers. Also states that he has been out of his blood pressure medication.  Records reviewed. Per medical record review patient has a history of hypertension.  Past Medical History:  Diagnosis Date  . Hypertension     Patient Active Problem List   Diagnosis Date Noted  . Acute renal failure (ARF) ([place]) 03/07/2017    History reviewed. No pertinent surgical history.  Prior to Admission medications   Medication Sig Start Date End Date Taking? Authorizing Provider  acetaminophen (TYLENOL) 500 MG tablet Take 500 mg by mouth every 6 (six) hours as needed.    [provider]  benzonatate (TESSALON PERLES) 100 MG capsule Take 1 capsule (100 mg total) by mouth 3 (three) times daily as needed for cough. 06/18/19 06/17/20  Vanessa Atoka, MD  cyclobenzaprine (FLEXERIL) 5 MG tablet Take 1-2 tablets 3 times daily as needed 06/27/19   Laban Emperor, PA-C  diazepam (VALIUM) 2 MG tablet Take 1 tablet (2 mg total) by mouth every 8 (eight) hours as needed for muscle spasms. 04/29/18   Paulette Blanch, MD  lidocaine (LIDODERM) 5 % Place 1 patch onto the skin daily. Remove & Discard patch within 12 hours  or as directed by MD 06/27/19   Laban Emperor, PA-C  meloxicam (MOBIC) 7.5 MG tablet Take 1 tablet (7.5 mg total) by mouth daily. 06/27/19 06/26/20  Laban Emperor, PA-C    Allergies Patient has no known allergies.  No family history on file.  Social History Social History   Tobacco Use  . Smoking status: Former Smoker    Types: Cigarettes    Quit date: 08/09/2016    Years since quitting: 3.5  . Smokeless tobacco: Never Used  Vaping Use  . Vaping Use: Never assessed  Substance Use Topics  . Alcohol use: Yes    Alcohol/week: 12.0 standard drinks    Types: 12 Cans of beer per week  . Drug use: No    Review of Systems Constitutional: No fever/chills Eyes: No visual changes. ENT: No sore throat. Cardiovascular: Denies chest pain. Respiratory: Denies shortness of breath. Gastrointestinal: Positive for abdominal pain, nausea and vomiting.   Genitourinary: Negative for dysuria. Musculoskeletal: Negative for back pain. Skin: Negative for rash. Neurological: Negative for headaches, focal weakness or numbness.  ____________________________________________   PHYSICAL EXAM:  VITAL SIGNS: ED Triage Vitals  Enc Vitals Group     BP 02/23/20 1439 (!) 157/103     Pulse Rate 02/23/20 1439 (!) 117     Resp 02/23/20 1439 20     Temp 02/23/20 1439 98.5 F (36.9 C)     Temp Source 02/23/20 1439 Oral     SpO2 02/23/20  1439 99 %     Weight 02/23/20 1440 247 lb (112 kg)     Height 02/23/20 1440 _0  (1.676 m)     Head Circumference --      Peak Flow --      Pain Score 02/23/20 1439 8   Constitutional: Alert and oriented.  Eyes: Conjunctivae are normal.  ENT      Head: Normocephalic and atraumatic.      Nose: No congestion/rhinnorhea.      Mouth/Throat: Mucous membranes are moist.      Neck: No stridor. Hematological/Lymphatic/Immunilogical: No cervical lymphadenopathy. Cardiovascular: Normal rate, regular rhythm.  No murmurs, rubs, or gallops.  Respiratory: Normal  respiratory effort without tachypnea nor retractions. Breath sounds are clear and equal bilaterally. No wheezes/rales/rhonchi. Gastrointestinal: Soft and non tender. No rebound. No guarding.  Genitourinary: Deferred Musculoskeletal: Normal range of motion in all extremities. No lower extremity edema. Neurologic:  Normal speech and language. No gross focal neurologic deficits are appreciated.  Skin:  Skin is warm, dry and intact. No rash noted. Psychiatric: Mood and affect are normal. Speech and behavior are normal. Patient exhibits appropriate insight and judgment.  ____________________________________________    LABS (pertinent positives/negatives)  Lipase 63 Ethanol 66 CMP na 134, k 3.9, glu 137, cr 0.73, ast 858, alt 557, alk phos 254, t bili 2.4 CBC wbc 10.7, hgb 18.8, plt 322 UA clear, small hgb dipstick, 0-5 rbc and wbc  ____________________________________________   EKG  I, Nance Pear, attending physician, personally viewed and interpreted this EKG  EKG Time: 1444 Rate: 107 Rhythm: sinus tachycardia Axis: left axis deviation Intervals: qtc 472 QRS: narrow ST changes: no st elevation Impression: abnormal ekg ____________________________________________    RADIOLOGY  None  ____________________________________________   PROCEDURES  Procedures  ____________________________________________   INITIAL IMPRESSION / ASSESSMENT AND PLAN / ED COURSE  Pertinent labs & imaging results that were available during my care of the patient were reviewed by me and considered in my medical decision making (see chart for details).   Patient presented to the emergency department because of concern for abdominal pain, nausea in the setting of heavy alcohol use. Blood work was noticeable for elevated LFTs. [name] without evidence of obstructive disease. Do think likely alcohol hepatitis. Discussed with Dr. Alice Reichert. Plan to admit to the hospital for further work up and management.  Discussed findings and plan with patient.   ____________________________________________   FINAL CLINICAL IMPRESSION(S) / ED DIAGNOSES  Final diagnoses:  Abdominal pain  Alcoholic hepatitis, unspecified whether ascites present     Note: This dictation was prepared with Dragon dictation. Any transcriptional errors that result from this process are unintentional     Nance Pear, MD 02/23/20 2012

## 2020-02-23 NOTE — ED Triage Notes (Signed)
Reports he has abdominal pain, nausea. Ready to quit drinking alcohol, quit yesterday. Usually drinks 1-2 bottles of tequila daily.

## 2020-02-24 DIAGNOSIS — K7010 Alcoholic hepatitis without ascites: Secondary | ICD-10-CM

## 2020-02-24 DIAGNOSIS — K852 Alcohol induced acute pancreatitis without necrosis or infection: Secondary | ICD-10-CM

## 2020-02-24 LAB — COMPREHENSIVE METABOLIC PANEL
ALT: 434 U/L — ABNORMAL HIGH (ref 0–44)
AST: 488 U/L — ABNORMAL HIGH (ref 15–41)
Albumin: 3.4 g/dL — ABNORMAL LOW (ref 3.5–5.0)
Alkaline Phosphatase: 226 U/L — ABNORMAL HIGH (ref 38–126)
Anion gap: 12 (ref 5–15)
BUN: 13 mg/dL (ref 6–20)
CO2: 23 mmol/L (ref 22–32)
Calcium: 8.1 mg/dL — ABNORMAL LOW (ref 8.9–10.3)
Chloride: 94 mmol/L — ABNORMAL LOW (ref 98–111)
Creatinine, Ser: 0.69 mg/dL (ref 0.61–1.24)
GFR calc Af Amer: >60 mL/min (ref >60)
GFR calc non Af Amer: >60 mL/min (ref >60)
Glucose, Bld: 133 mg/dL — ABNORMAL HIGH (ref 70–99)
Potassium: 3.6 mmol/L (ref 3.5–5.1)
Sodium: 129 mmol/L — ABNORMAL LOW (ref 135–145)
Total Bilirubin: 5.6 mg/dL — ABNORMAL HIGH (ref 0.3–1.2)
Total Protein: 6.9 g/dL (ref 6.5–8.1)

## 2020-02-24 LAB — CBC
HCT: 50.5 % (ref 39.0–52.0)
Hemoglobin: 17.5 g/dL — ABNORMAL HIGH (ref 13.0–17.0)
MCH: 28.5 pg (ref 26.0–34.0)
MCHC: 34.7 g/dL (ref 30.0–36.0)
MCV: 82.4 fL (ref 80.0–100.0)
Platelets: 291 10*3/uL (ref 150–400)
RBC: 6.13 MIL/uL — ABNORMAL HIGH (ref 4.22–5.81)
RDW: 13.6 % (ref 11.5–15.5)
WBC: 14.7 10*3/uL — ABNORMAL HIGH (ref 4.0–10.5)
nRBC: 0 % (ref 0.0–0.2)

## 2020-02-24 LAB — TSH: TSH: 1.446 u[IU]/mL (ref 0.350–4.500)

## 2020-02-24 LAB — HIV ANTIBODY (ROUTINE TESTING W REFLEX): HIV Screen 4th Generation wRfx: NONREACTIVE

## 2020-02-24 LAB — MAGNESIUM: Magnesium: 2 mg/dL (ref 1.7–2.4)

## 2020-02-24 LAB — PHOSPHORUS: Phosphorus: 3.7 mg/dL (ref 2.5–4.6)

## 2020-02-24 LAB — HEPATITIS PANEL, ACUTE
HCV Ab: NONREACTIVE
Hep A IgM: NONREACTIVE
Hep B C IgM: NONREACTIVE
Hepatitis B Surface Ag: NONREACTIVE

## 2020-02-24 LAB — HEMOGLOBIN A1C
Hgb A1c MFr Bld: 6.7 % — ABNORMAL HIGH (ref 4.8–5.6)
Mean Plasma Glucose: 145.59 mg/dL

## 2020-02-24 MED ORDER — BISACODYL 10 MG RE SUPP
10.0000 mg | Freq: Every day | RECTAL | Status: DC | PRN
  Administered 2020-02-24: 10 mg via RECTAL
  Filled 2020-02-24: qty 1

## 2020-02-24 MED ORDER — LABETALOL HCL 5 MG/ML IV SOLN
10.0000 mg | INTRAVENOUS | Status: DC | PRN
  Filled 2020-02-24: qty 4

## 2020-02-24 MED ORDER — MAGNESIUM CITRATE PO SOLN
1.0000 | Freq: Once | ORAL | Status: AC
  Administered 2020-02-24: 1 via ORAL
  Filled 2020-02-24: qty 296

## 2020-02-24 MED ORDER — ONDANSETRON HCL 4 MG/2ML IJ SOLN
4.0000 mg | Freq: Four times a day (QID) | INTRAMUSCULAR | Status: DC | PRN
  Administered 2020-02-24: 4 mg via INTRAVENOUS
  Filled 2020-02-24: qty 2

## 2020-02-24 MED ORDER — LISINOPRIL 10 MG PO TABS
10.0000 mg | ORAL_TABLET | Freq: Every day | ORAL | Status: DC
  Administered 2020-02-24 – 2020-02-25 (×2): 10 mg via ORAL
  Filled 2020-02-24 (×2): qty 1

## 2020-02-24 MED ORDER — HYDROMORPHONE HCL 1 MG/ML IJ SOLN
1.0000 mg | INTRAMUSCULAR | Status: DC | PRN
  Administered 2020-02-24 (×5): 1 mg via INTRAVENOUS
  Filled 2020-02-24 (×5): qty 1

## 2020-02-24 MED ORDER — METOPROLOL TARTRATE 25 MG PO TABS
12.5000 mg | ORAL_TABLET | Freq: Two times a day (BID) | ORAL | Status: DC
  Administered 2020-02-24 – 2020-02-26 (×5): 12.5 mg via ORAL
  Filled 2020-02-24 (×5): qty 1

## 2020-02-24 MED ORDER — MORPHINE SULFATE (PF) 2 MG/ML IV SOLN
2.0000 mg | Freq: Once | INTRAVENOUS | Status: AC
  Administered 2020-02-24: 2 mg via INTRAVENOUS

## 2020-02-24 MED ORDER — ENOXAPARIN SODIUM 40 MG/0.4ML ~~LOC~~ SOLN
40.0000 mg | SUBCUTANEOUS | Status: DC
  Administered 2020-02-24 – 2020-02-25 (×2): 40 mg via SUBCUTANEOUS
  Filled 2020-02-24 (×2): qty 0.4

## 2020-02-24 NOTE — Progress Notes (Signed)
ANTICOAGULATION CONSULT NOTE - Initial Consult  Pharmacy Consult for Lovenox Indication: VTE prophylaxis  No Known Allergies  Patient Measurements: Height: 5\' 6"  (167.6 cm) Weight: 112 kg (247 lb) IBW/kg (Calculated) : 63.8 Heparin Dosing Weight:   Vital Signs: Temp: 98.4 F (36.9 C) (07/15 0810) Temp Source: Oral (07/15 0810) BP: 137/89 (07/15 0810) Pulse Rate: 112 (07/15 0620)  Labs: Recent Labs    02/23/20 1443 02/23/20 1443 02/23/20 1836 02/23/20 2322 02/24/20 0618  HGB 18.8*   < >  --  17.8* 17.5*  HCT 53.3*  --   --  51.4 50.5  PLT 322  --   --  331 291  APTT  --   --  30  --   --   LABPROT  --   --  14.1  --   --   INR  --   --  1.1  --   --   CREATININE 0.73  --   --   --  0.69   < > = values in this interval not displayed.    Estimated Creatinine Clearance: 128.4 mL/min (by C-G formula based on SCr of 0.69 mg/dL).   Medical History: Past Medical History:  Diagnosis Date  . Abdominal pain   . Alcohol abuse   . Hypertension   . Psoriasis     Medications:  Scheduled:  . chlordiazePOXIDE  5 mg Oral QID  . enoxaparin (LOVENOX) injection  40 mg Subcutaneous Q24H  . folic acid  1 mg Oral Daily  . lisinopril  10 mg Oral Daily  . metoprolol tartrate  12.5 mg Oral BID  . multivitamin with minerals  1 tablet Oral Daily  . thiamine  100 mg Oral Daily   Or  . thiamine  100 mg Intravenous Daily    Assessment: Pharmacy consulted to dose lovenox for DVT prophylaxis in this 54 year old male. CrCl = 128.4 ml/min BMI = 39.87    Goal of Therapy:  prevention of DVT     Plan:  Lovenox 40 mg SQ Q24 ordered to start on 7/15 @ 2200.   Jabriel Vanduyne D 02/24/2020,3:34 PM

## 2020-02-24 NOTE — Progress Notes (Signed)
NP notified of hr and pt pain level

## 2020-02-24 NOTE — Progress Notes (Signed)
PROGRESS NOTE    Christopher Norton  YDX:[id number] DOB: 07-Feb-1966 DOA: 02/23/2020 PCP: Patient, No Pcp Per   Brief Narrative:  Christopher Norton is a 54 y.o. male with medical history significant of Alcohol abuse seen in ed for abdominal pain that started 3 days ago and has progressively gotten worse. Pt states  His abd pain is left side of abdomen.  has had psoriasis for 9 years, does not have dermatologist. Patient initially started with intermittent pain which is more consistent now associated with nausea and vomiting. Has a longstanding history of alcohol abuse, he wants to quit as his wife threatened to leave him if he continued to drink. Has an history of becoming shaky if he does not drink.  Subjective: Patient was complaining of constipation and abdominal distention causing pain.  His last bowel movement was 5 days ago.  Mild nausea but no vomiting.  Assessment & Plan:   Active Problems:   Abdominal pain  Abdominal pain/transaminitis/alcoholic pancreatitis.  Lipase elevated at 63, CT abdomen without any acute abnormality. Ultrasound liver with dense echogenic throughout the liver which is consistent with either steatosis or some other intrinsic hepatocellular disease. Elevated liver enzymes.  Constipation might be playing some role in worsening of abdominal pain and distention. -Check hepatitis panel. -Supportive care. -Bowel regimen  Alcohol abuse.  Patient with history of becoming shaky when trying to quit. Ethanol levels were 60 on admission.  Last drink a day prior to admission. -Continue CIWA protocol.  History of psoriasis.  Patient needs to see dermatologist or a rheumatologist as an outpatient.  Hypertension.  Blood pressure mildly elevated.  Apparently patient was out of his antihypertensives for some time now. -Restart home dose of metoprolol and lisinopril.  Objective: Vitals:   02/24/20 0300 02/24/20 0500 02/24/20 0620 02/24/20 0810  BP:   138/81 (!) 146/100 137/89  Pulse:  (!) 116 (!) 112   Resp:  (!) 24 20 15   Temp: 97.8 F (36.6 C)  98.3 F (36.8 C) 98.4 F (36.9 C)  TempSrc: Oral  Oral Oral  SpO2:  97% 99% 97%  Weight:      Height:        Intake/Output Summary (Last 24 hours) at 02/24/2020 1514 Last data filed at 02/24/2020 0900 Gross per 24 hour  Intake 360 ml  Output --  Net 360 ml   Filed Weights   02/23/20 1440  Weight: 112 kg    Examination:  General exam: Well developed, obese gentleman, appears calm and comfortable  Respiratory system: Clear to auscultation. Respiratory effort normal. Cardiovascular system: S1 & S2 heard, RRR. No JVD, murmurs, rubs, gallops or clicks. Gastrointestinal system: Soft, mild diffuse tenderness with some distention, bowel sounds positive. Central nervous system: Alert and oriented. No focal neurological deficits.Symmetric 5 x 5 power. Extremities: No edema, no cyanosis, pulses intact and symmetrical. Psychiatry: Judgement and insight appear normal. Mood & affect appropriate.    DVT prophylaxis: Lovenox Code Status: Full Family Communication: No Family at bedside Disposition Plan:  Status is: Inpatient  Remains inpatient appropriate because:Inpatient level of care appropriate due to severity of illness   Dispo: The patient is from: Home              Anticipated d/c is to: Home              Anticipated d/c date is: 1 day              Patient currently is not medically  stable to d/c.   Consultants:   None  Procedures:  Antimicrobials:   Data Reviewed: I have personally reviewed following labs and imaging studies  CBC: Recent Labs  Lab 02/23/20 1443 02/23/20 2322 02/24/20 0618  WBC 10.7* 17.9* 14.7*  HGB 18.8* 17.8* 17.5*  HCT 53.3* 51.4 50.5  MCV 81.3 82.1 82.4  PLT 322 331 291   Basic Metabolic Panel: Recent Labs  Lab 02/22/20 2322 02/23/20 1443 02/24/20 0618  NA  --  134* 129*  K  --  3.9 3.6  CL  --  98 94*  CO2  --  23 23  GLUCOSE   --  137* 133*  BUN  --  12 13  CREATININE  --  0.73 0.69  CALCIUM  --  8.2* 8.1*  MG 2.0  --   --   PHOS 3.7  --   --    GFR: Estimated Creatinine Clearance: 128.4 mL/min (by C-G formula based on SCr of 0.69 mg/dL). Liver Function Tests: Recent Labs  Lab 02/23/20 1443 02/24/20 0618  AST 858* 488*  ALT 557* 434*  ALKPHOS 254* 226*  BILITOT 2.4* 5.6*  PROT 7.3 6.9  ALBUMIN 3.7 3.4*   Recent Labs  Lab 02/23/20 1443  LIPASE 63*   No results for input(s): AMMONIA in the last 168 hours. Coagulation Profile: Recent Labs  Lab 02/23/20 1836  INR 1.1   Cardiac Enzymes: No results for input(s): CKTOTAL, CKMB, CKMBINDEX, TROPONINI in the last 168 hours. BNP (last 3 results) No results for input(s): PROBNP in the last 8760 hours. HbA1C: Recent Labs    02/23/20 2024  HGBA1C 6.7*   CBG: No results for input(s): GLUCAP in the last 168 hours. Lipid Profile: No results for input(s): CHOL, HDL, LDLCALC, TRIG, CHOLHDL, LDLDIRECT in the last 72 hours. Thyroid Function Tests: Recent Labs    02/22/20 2322  TSH 1.446   Anemia Panel: No results for input(s): VITAMINB12, FOLATE, FERRITIN, TIBC, IRON, RETICCTPCT in the last 72 hours. Sepsis Labs: No results for input(s): PROCALCITON, LATICACIDVEN in the last 168 hours.  Recent Results (from the past 240 hour(s))  SARS Coronavirus 2 by RT PCR (hospital order, performed in [facility] hospital lab) Nasopharyngeal Nasopharyngeal Swab     Status: None   Collection Time: 02/23/20  8:04 PM   Specimen: Nasopharyngeal Swab  Result Value Ref Range Status   SARS Coronavirus 2 NEGATIVE NEGATIVE Final    Comment: (NOTE) SARS-CoV-2 target nucleic acids are NOT DETECTED.  The SARS-CoV-2 RNA is generally detectable in upper and lower respiratory specimens during the acute phase of infection. The lowest concentration of SARS-CoV-2 viral copies this assay can detect is 250 copies / mL. A negative result does not preclude SARS-CoV-2  infection and should not be used as the sole basis for treatment or other patient management decisions.  A negative result may occur with improper specimen collection / handling, submission of specimen other than nasopharyngeal swab, presence of viral mutation(s) within the areas targeted by this assay, and inadequate number of viral copies (<250 copies / mL). A negative result must be combined with clinical observations, patient history, and epidemiological information.  Fact Sheet for Patients:   BoilerBrush.com.cy  Fact Sheet for Healthcare Providers: https://pope.com/  This test is not yet approved or  cleared by the [place] FDA and has been authorized for detection and/or diagnosis of SARS-CoV-2 by FDA under an Emergency Use Authorization (EUA).  This EUA will remain in effect (meaning this test can  be used) for the duration of the COVID-19 declaration under Section 564(b)(1) of the Act, 21 U.S.C. section 360bbb-3(b)(1), unless the authorization is terminated or revoked sooner.  Performed at [facility], [address] [place], [place] [address]      Radiology Studies: CT ABDOMEN PELVIS W CONTRAST  Result Date: 02/24/2020 CLINICAL DATA:  54 year old male with abdominal pain. EXAM: CT ABDOMEN AND PELVIS WITH CONTRAST TECHNIQUE: Multidetector CT imaging of the abdomen and pelvis was performed using the standard protocol following bolus administration of intravenous contrast. CONTRAST:  OMNIPAQUE IOHEXOL 300 MG/ML  SOLN COMPARISON:  Right upper quadrant ultrasound dated 02/23/2020. FINDINGS: Lower chest: The visualized lung bases are clear. There is a small calcified granuloma at the right lung base. No intra-abdominal free air or free fluid. Hepatobiliary: Diffuse fatty liver. No intrahepatic biliary ductal dilatation. The gallbladder is unremarkable. Pancreas: There is inflammatory changes of the distal body and  tail of the pancreas consistent with acute pancreatitis. Correlation with pancreatic enzymes recommended. No drainable fluid collection/abscess or pseudocyst. Spleen: Normal in size without focal abnormality. Adrenals/Urinary Tract: The adrenal glands unremarkable. The kidneys, visualized ureters, and urinary bladder appear unremarkable. Stomach/Bowel: Mild diffuse thickened appearance of the colon, likely related to underdistention. Colitis is less likely. Clinical correlation is recommended. There is no bowel obstruction. The appendix is normal. Vascular/Lymphatic: The abdominal aorta and IVC are unremarkable. No portal venous gas. There is no adenopathy. Reproductive: The prostate and seminal vesicles are grossly unremarkable. Other: None Musculoskeletal: No acute or significant osseous findings. IMPRESSION: 1. Acute pancreatitis. No abscess or pseudocyst. 2. Fatty liver. 3. Underdistention of the colon versus less likely mild colitis. Clinical correlation is recommended. No bowel obstruction. Normal appendix. Electronically Signed   By: Elgie Collard M.D.   On: 02/24/2020 00:08   US Abdomen Limited RUQ  Result Date: 02/23/2020 CLINICAL DATA:  Initial evaluation for acute abdominal pain for 3 days. EXAM: ULTRASOUND ABDOMEN LIMITED RIGHT UPPER QUADRANT COMPARISON:  None. FINDINGS: Gallbladder: No gallstones or wall thickening visualized. No sonographic Murphy sign noted by sonographer. Common bile duct: Diameter: 3.4 mm Liver: No focal lesion identified. Liver demonstrates a diffusely dense echogenic echotexture. Portal vein is patent on color Doppler imaging with normal direction of blood flow towards the liver. Other: None. IMPRESSION: 1. Dense echogenic echotexture throughout the liver, which can be seen with steatosis and/or other intrinsic hepatocellular disease. Correlation with LFTs suggested. 2. Normal sonographic evaluation of the gallbladder. No biliary dilatation. Electronically Signed   By:  Rise Mu M.D.   On: 02/23/2020 19:16    Scheduled Meds: . chlordiazePOXIDE  5 mg Oral QID  . folic acid  1 mg Oral Daily  . heparin  5,000 Units Subcutaneous Q8H  . metoprolol tartrate  12.5 mg Oral BID  . multivitamin with minerals  1 tablet Oral Daily  . thiamine  100 mg Oral Daily   Or  . thiamine  100 mg Intravenous Daily   Continuous Infusions:   LOS: 1 day   Time spent: 40 minutes.  I personally reviewed his chart and imaging.  Arnetha Courser, MD [facility]  If 7PM-7AM, please contact night-coverage [email or web address]  02/24/2020, 3:14 PM   This record has been created using Conservation officer, historic buildings. Errors have been sought and corrected,but may not always be located. Such creation errors do not reflect on the standard of care.

## 2020-02-24 NOTE — Progress Notes (Signed)
Report called to Kim RN. 

## 2020-02-25 LAB — CBC
HCT: 49.9 % (ref 39.0–52.0)
Hemoglobin: 17.4 g/dL — ABNORMAL HIGH (ref 13.0–17.0)
MCH: 28.8 pg (ref 26.0–34.0)
MCHC: 34.9 g/dL (ref 30.0–36.0)
MCV: 82.6 fL (ref 80.0–100.0)
Platelets: 241 10*3/uL (ref 150–400)
RBC: 6.04 MIL/uL — ABNORMAL HIGH (ref 4.22–5.81)
RDW: 14 % (ref 11.5–15.5)
WBC: 27.7 10*3/uL — ABNORMAL HIGH (ref 4.0–10.5)
nRBC: 0 % (ref 0.0–0.2)

## 2020-02-25 LAB — COMPREHENSIVE METABOLIC PANEL
ALT: 297 U/L — ABNORMAL HIGH (ref 0–44)
AST: 237 U/L — ABNORMAL HIGH (ref 15–41)
Albumin: 3.3 g/dL — ABNORMAL LOW (ref 3.5–5.0)
Alkaline Phosphatase: 222 U/L — ABNORMAL HIGH (ref 38–126)
Anion gap: 10 (ref 5–15)
BUN: 14 mg/dL (ref 6–20)
CO2: 28 mmol/L (ref 22–32)
Calcium: 8.3 mg/dL — ABNORMAL LOW (ref 8.9–10.3)
Chloride: 93 mmol/L — ABNORMAL LOW (ref 98–111)
Creatinine, Ser: 0.83 mg/dL (ref 0.61–1.24)
GFR calc Af Amer: >60 mL/min (ref >60)
GFR calc non Af Amer: >60 mL/min (ref >60)
Glucose, Bld: 161 mg/dL — ABNORMAL HIGH (ref 70–99)
Potassium: 3.8 mmol/L (ref 3.5–5.1)
Sodium: 131 mmol/L — ABNORMAL LOW (ref 135–145)
Total Bilirubin: 5.2 mg/dL — ABNORMAL HIGH (ref 0.3–1.2)
Total Protein: 7.4 g/dL (ref 6.5–8.1)

## 2020-02-25 MED ORDER — HYDROMORPHONE HCL 1 MG/ML IJ SOLN
1.0000 mg | INTRAMUSCULAR | Status: DC | PRN
  Administered 2020-02-25 (×4): 1 mg via INTRAVENOUS
  Filled 2020-02-25 (×5): qty 1

## 2020-02-25 MED ORDER — LISINOPRIL 20 MG PO TABS
20.0000 mg | ORAL_TABLET | Freq: Every day | ORAL | Status: DC
  Administered 2020-02-26: 20 mg via ORAL
  Filled 2020-02-25: qty 1

## 2020-02-25 MED ORDER — KETOROLAC TROMETHAMINE 30 MG/ML IJ SOLN
30.0000 mg | Freq: Four times a day (QID) | INTRAMUSCULAR | Status: DC | PRN
  Administered 2020-02-25 (×2): 30 mg via INTRAVENOUS
  Filled 2020-02-25 (×3): qty 1

## 2020-02-25 MED ORDER — HYDROMORPHONE HCL 1 MG/ML IJ SOLN
0.5000 mg | Freq: Four times a day (QID) | INTRAMUSCULAR | Status: DC | PRN
  Administered 2020-02-25: 0.5 mg via INTRAVENOUS
  Filled 2020-02-25: qty 0.5

## 2020-02-25 NOTE — Progress Notes (Signed)
RN notified on call provider Manuela Schwartz NP of concerns about the following: patient has increase WBC 27.7 and urine had bacteria in it on 7/14. There is no C&S  for the urine pending nor is there any antibiotics ordered. Is this an over look? Also patient has blood in the urine and on Lovenox. Hg is 17.4. Provider responded: UA does not look like UTI. leukocytosis could be very well related to inflammatory reactivity to his alcohol induced hepatits. No other infection sites identified per note. Abdominal pain noted as improving and transaminitis improving as well.

## 2020-02-25 NOTE — TOC Initial Note (Addendum)
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   Patient states that he does not have PCP - provided information on [facility] , [facility]  And [employer].  Patient states that he uses [facility] for his medications, and denies issues obtaining medications  TOC consult for substance abuse.  Patient states that he drinks 2 fifths of tequila a day.  Patient states "it's time for me to stop"  Patient agreeable to substance abuse resources.  Provided inpatient and residential treatment     TOC following for medications needs.  Patient provided with information on [email or web address].com and showed him how to use it on his phone   Expected Discharge Plan: Home/Self Care Barriers to Discharge: No Barriers Identified   Patient Goals and CMS Choice        Expected Discharge Plan and Services Expected Discharge Plan: Home/Self Care   Discharge Planning Services: CM Consult   Living arrangements for the past 2 months: Apartment                                      Prior Living Arrangements/Services Living arrangements for the past 2 months: Apartment Lives with:: Significant Other Patient language and need for interpreter reviewed:: Yes Do you feel safe going back to the place where you live?: Yes      Need for Family Participation in Patient Care: No (Comment) Care giver support system in place?: Yes (comment)   Criminal Activity/Legal Involvement Pertinent to Current Situation/Hospitalization: No - Comment as needed  Activities of Daily Living Home  Assistive Devices/Equipment: None ADL Screening (condition at time of admission) Patient's cognitive ability adequate to safely complete daily activities?: Yes Is the patient deaf or have difficulty hearing?: No Does the patient have difficulty seeing, even when wearing glasses/contacts?: No Does the patient have difficulty concentrating, remembering, or making decisions?: No Patient able to express need for assistance with ADLs?: Yes Does the patient have difficulty dressing or bathing?: No Independently performs ADLs?: Yes (appropriate for developmental age) Does the patient have difficulty walking or climbing stairs?: Yes Weakness of Legs: None Weakness of Arms/Hands: None  Permission Sought/Granted                  Emotional Assessment Appearance:: Appears stated age     Orientation: : Oriented to Self, Oriented to Place, Oriented to  Time, Oriented to Situation Alcohol / Substance Use: Alcohol Use Psych Involvement: No (comment)  Admission diagnosis:  Abdominal pain [R10.9] Alcoholic hepatitis, unspecified whether ascites present [K70.10] Patient Active Problem List   Diagnosis Date Noted  . Alcoholic hepatitis   . Alcohol-induced acute pancreatitis   . Abdominal pain 02/23/2020  . Acute renal failure (ARF) (HCC) 03/07/2017   PCP:  Patient, No Pcp Per Pharmacy:   [id number] [place], [place] - [address] [place] [address] Phone: [phone] Fax: [phone]  [facility]  [place], [place] AT [facility] [address] [place] [address] Phone: [phone] Fax: [phone]     Social Determinants of Health (SDOH) Interventions    Readmission Risk Interventions No flowsheet data found.

## 2020-02-25 NOTE — Progress Notes (Signed)
Patient administered routine metoprolol for B/P and HR (MEWS yellow). Will recheck vital signs.

## 2020-02-25 NOTE — Progress Notes (Signed)
PROGRESS NOTE    Christopher Norton  VQM:[id number] DOB: 27-Jun-1966 DOA: 02/23/2020 PCP: Patient, No Pcp Per   Brief Narrative:  Christopher Norton is a 54 y.o. male with medical history significant of Alcohol abuse seen in ed for abdominal pain that started 3 days ago and has progressively gotten worse. Pt states  His abd pain is left side of abdomen.  has had psoriasis for 9 years, does not have dermatologist. Patient initially started with intermittent pain which is more consistent now associated with nausea and vomiting. Has a longstanding history of alcohol abuse, he wants to quit as his wife threatened to leave him if he continued to drink. Has an history of becoming shaky if he does not drink.  Subjective: Patient continued to have bilateral upper quadrant abdominal pain.  Nausea and vomiting improved.  He was able to tolerate full liquid diet.  Had couple of bowel movements overnight with bowel regimen and stating that helps with his abdominal pain.  Continue to ask for pain medications.  Assessment & Plan:   Active Problems:   Abdominal pain   Alcoholic hepatitis   Alcohol-induced acute pancreatitis  Abdominal pain/transaminitis/alcoholic pancreatitis/alcoholic hepatitis.  Lipase elevated at 63, CT abdomen without any acute abnormality. Ultrasound liver with dense echogenic throughout the liver which is consistent with either steatosis or some other intrinsic hepatocellular disease. Elevated liver enzymes. -Started improving. -Hepatitis panel negative-most likely alcoholic hepatitis.  Maddrey discrimination score of 6 which does not indicate steroid use at this time. -Supportive care. -Bowel regimen  Alcohol abuse.  Patient with history of becoming shaky when trying to quit. Ethanol levels were 60 on admission.  Last drink a day prior to admission. -Continue CIWA protocol.  History of psoriasis.  Patient needs to see dermatologist or a rheumatologist as an  outpatient.  Hypertension.  Blood pressure remained elevated.  Apparently patient was out of his antihypertensives for some time now. -Restarted home dose of metoprolol and lisinopril. -Increase lisinopril from 10-20.  Objective: Vitals:   02/25/20 0025 02/25/20 0340 02/25/20 0813 02/25/20 1154  BP: (!) 154/107 (!) 135/100 (!) 152/99 (!) 139/107  Pulse: (!) 115 (!) 112 (!) 115 (!) 109  Resp:  16 (!) 21 (!) 26  Temp:  98.4 F (36.9 C) 97.7 F (36.5 C) 98.2 F (36.8 C)  TempSrc:  Oral Oral Oral  SpO2:  96% 95% 90%  Weight:      Height:        Intake/Output Summary (Last 24 hours) at 02/25/2020 1445 Last data filed at 02/25/2020 1347 Gross per 24 hour  Intake 480 ml  Output --  Net 480 ml   Filed Weights   02/23/20 1440  Weight: 112 kg    Examination:  General exam: Well developed, obese gentleman, appears calm and comfortable  Respiratory system: Clear to auscultation. Respiratory effort normal. Cardiovascular system: S1 & S2 heard, RRR. No JVD, murmurs, rubs, gallops or clicks. Gastrointestinal system: Soft, mild diffuse tenderness, bowel sounds positive. Central nervous system: Alert and oriented. No focal neurological deficits. Extremities: No edema, no cyanosis, pulses intact and symmetrical. Skin.  Multiple scaly psoriatic rash involving all extremities and abdomen. Psychiatry: Judgement and insight appear normal.   DVT prophylaxis: Lovenox Code Status: Full Family Communication: No Family at bedside Disposition Plan:  Status is: Inpatient  Remains inpatient appropriate because:Inpatient level of care appropriate due to severity of illness   Dispo: The patient is from: Home  Anticipated d/c is to: Home              Anticipated d/c date is: 1 day              Patient currently is not medically stable to d/c.   Consultants:   None  Procedures:  Antimicrobials:   Data Reviewed: I have personally reviewed following labs and imaging  studies  CBC: Recent Labs  Lab 02/23/20 1443 02/23/20 2322 02/24/20 0618 02/25/20 0510  WBC 10.7* 17.9* 14.7* 27.7*  HGB 18.8* 17.8* 17.5* 17.4*  HCT 53.3* 51.4 50.5 49.9  MCV 81.3 82.1 82.4 82.6  PLT 322 331 291 241   Basic Metabolic Panel: Recent Labs  Lab 02/22/20 2322 02/23/20 1443 02/24/20 0618 02/25/20 0510  NA  --  134* 129* 131*  K  --  3.9 3.6 3.8  CL  --  98 94* 93*  CO2  --  23 23 28   GLUCOSE  --  137* 133* 161*  BUN  --  12 13 14   CREATININE  --  0.73 0.69 0.83  CALCIUM  --  8.2* 8.1* 8.3*  MG 2.0  --   --   --   PHOS 3.7  --   --   --    GFR: Estimated Creatinine Clearance: 123.8 mL/min (by C-G formula based on SCr of 0.83 mg/dL). Liver Function Tests: Recent Labs  Lab 02/23/20 1443 02/24/20 0618 02/25/20 0510  AST 858* 488* 237*  ALT 557* 434* 297*  ALKPHOS 254* 226* 222*  BILITOT 2.4* 5.6* 5.2*  PROT 7.3 6.9 7.4  ALBUMIN 3.7 3.4* 3.3*   Recent Labs  Lab 02/23/20 1443  LIPASE 63*   No results for input(s): AMMONIA in the last 168 hours. Coagulation Profile: Recent Labs  Lab 02/23/20 1836  INR 1.1   Cardiac Enzymes: No results for input(s): CKTOTAL, CKMB, CKMBINDEX, TROPONINI in the last 168 hours. BNP (last 3 results) No results for input(s): PROBNP in the last 8760 hours. HbA1C: Recent Labs    02/23/20 2024  HGBA1C 6.7*   CBG: No results for input(s): GLUCAP in the last 168 hours. Lipid Profile: No results for input(s): CHOL, HDL, LDLCALC, TRIG, CHOLHDL, LDLDIRECT in the last 72 hours. Thyroid Function Tests: Recent Labs    02/22/20 2322  TSH 1.446   Anemia Panel: No results for input(s): VITAMINB12, FOLATE, FERRITIN, TIBC, IRON, RETICCTPCT in the last 72 hours. Sepsis Labs: No results for input(s): PROCALCITON, LATICACIDVEN in the last 168 hours.  Recent Results (from the past 240 hour(s))  SARS Coronavirus 2 by RT PCR (hospital order, performed in [facility] hospital lab) Nasopharyngeal Nasopharyngeal Swab      Status: None   Collection Time: 02/23/20  8:04 PM   Specimen: Nasopharyngeal Swab  Result Value Ref Range Status   SARS Coronavirus 2 NEGATIVE NEGATIVE Final    Comment: (NOTE) SARS-CoV-2 target nucleic acids are NOT DETECTED.  The SARS-CoV-2 RNA is generally detectable in upper and lower respiratory specimens during the acute phase of infection. The lowest concentration of SARS-CoV-2 viral copies this assay can detect is 250 copies / mL. A negative result does not preclude SARS-CoV-2 infection and should not be used as the sole basis for treatment or other patient management decisions.  A negative result may occur with improper specimen collection / handling, submission of specimen other than nasopharyngeal swab, presence of viral mutation(s) within the areas targeted by this assay, and inadequate number of viral copies (<250 copies / mL).  A negative result must be combined with clinical observations, patient history, and epidemiological information.  Fact Sheet for Patients:   BoilerBrush.com.cy  Fact Sheet for Healthcare Providers: https://pope.com/  This test is not yet approved or  cleared by the [place] FDA and has been authorized for detection and/or diagnosis of SARS-CoV-2 by FDA under an Emergency Use Authorization (EUA).  This EUA will remain in effect (meaning this test can be used) for the duration of the COVID-19 declaration under Section 564(b)(1) of the Act, 21 U.S.C. section 360bbb-3(b)(1), unless the authorization is terminated or revoked sooner.  Performed at [facility], [address], [place], [place] [address]      Radiology Studies: CT ABDOMEN PELVIS W CONTRAST  Result Date: 02/24/2020 CLINICAL DATA:  54 year old male with abdominal pain. EXAM: CT ABDOMEN AND PELVIS WITH CONTRAST TECHNIQUE: Multidetector CT imaging of the abdomen and pelvis was performed using the standard protocol  following bolus administration of intravenous contrast. CONTRAST:  OMNIPAQUE IOHEXOL 300 MG/ML  SOLN COMPARISON:  Right upper quadrant ultrasound dated 02/23/2020. FINDINGS: Lower chest: The visualized lung bases are clear. There is a small calcified granuloma at the right lung base. No intra-abdominal free air or free fluid. Hepatobiliary: Diffuse fatty liver. No intrahepatic biliary ductal dilatation. The gallbladder is unremarkable. Pancreas: There is inflammatory changes of the distal body and tail of the pancreas consistent with acute pancreatitis. Correlation with pancreatic enzymes recommended. No drainable fluid collection/abscess or pseudocyst. Spleen: Normal in size without focal abnormality. Adrenals/Urinary Tract: The adrenal glands unremarkable. The kidneys, visualized ureters, and urinary bladder appear unremarkable. Stomach/Bowel: Mild diffuse thickened appearance of the colon, likely related to underdistention. Colitis is less likely. Clinical correlation is recommended. There is no bowel obstruction. The appendix is normal. Vascular/Lymphatic: The abdominal aorta and IVC are unremarkable. No portal venous gas. There is no adenopathy. Reproductive: The prostate and seminal vesicles are grossly unremarkable. Other: None Musculoskeletal: No acute or significant osseous findings. IMPRESSION: 1. Acute pancreatitis. No abscess or pseudocyst. 2. Fatty liver. 3. Underdistention of the colon versus less likely mild colitis. Clinical correlation is recommended. No bowel obstruction. Normal appendix. Electronically Signed   By: Elgie Collard M.D.   On: 02/24/2020 00:08   US Abdomen Limited RUQ  Result Date: 02/23/2020 CLINICAL DATA:  Initial evaluation for acute abdominal pain for 3 days. EXAM: ULTRASOUND ABDOMEN LIMITED RIGHT UPPER QUADRANT COMPARISON:  None. FINDINGS: Gallbladder: No gallstones or wall thickening visualized. No sonographic Murphy sign noted by sonographer. Common bile duct:  Diameter: 3.4 mm Liver: No focal lesion identified. Liver demonstrates a diffusely dense echogenic echotexture. Portal vein is patent on color Doppler imaging with normal direction of blood flow towards the liver. Other: None. IMPRESSION: 1. Dense echogenic echotexture throughout the liver, which can be seen with steatosis and/or other intrinsic hepatocellular disease. Correlation with LFTs suggested. 2. Normal sonographic evaluation of the gallbladder. No biliary dilatation. Electronically Signed   By: Rise Mu M.D.   On: 02/23/2020 19:16    Scheduled Meds: . chlordiazePOXIDE  5 mg Oral QID  . enoxaparin (LOVENOX) injection  40 mg Subcutaneous Q24H  . folic acid  1 mg Oral Daily  . lisinopril  10 mg Oral Daily  . metoprolol tartrate  12.5 mg Oral BID  . multivitamin with minerals  1 tablet Oral Daily  . thiamine  100 mg Oral Daily   Or  . thiamine  100 mg Intravenous Daily   Continuous Infusions:   LOS: 2 days  Time spent: 35 minutes.    Arnetha CourserSumayya Shalimar Mcclain, MD [facility]  If 7PM-7AM, please contact night-coverage [email or web address]  02/25/2020, 2:45 PM   This record has been created using Conservation officer, historic buildingsDragon voice recognition software. Errors have been sought and corrected,but may not always be located. Such creation errors do not reflect on the standard of care.

## 2020-02-26 LAB — COMPREHENSIVE METABOLIC PANEL
ALT: 203 U/L — ABNORMAL HIGH (ref 0–44)
AST: 185 U/L — ABNORMAL HIGH (ref 15–41)
Albumin: 2.9 g/dL — ABNORMAL LOW (ref 3.5–5.0)
Alkaline Phosphatase: 201 U/L — ABNORMAL HIGH (ref 38–126)
Anion gap: 8 (ref 5–15)
BUN: 17 mg/dL (ref 6–20)
CO2: 28 mmol/L (ref 22–32)
Calcium: 8 mg/dL — ABNORMAL LOW (ref 8.9–10.3)
Chloride: 88 mmol/L — ABNORMAL LOW (ref 98–111)
Creatinine, Ser: 0.82 mg/dL (ref 0.61–1.24)
GFR calc Af Amer: >60 mL/min (ref >60)
GFR calc non Af Amer: >60 mL/min (ref >60)
Glucose, Bld: 147 mg/dL — ABNORMAL HIGH (ref 70–99)
Potassium: 3.9 mmol/L (ref 3.5–5.1)
Sodium: 124 mmol/L — ABNORMAL LOW (ref 135–145)
Total Bilirubin: 8.1 mg/dL — ABNORMAL HIGH (ref 0.3–1.2)
Total Protein: 6.7 g/dL (ref 6.5–8.1)

## 2020-02-26 LAB — CBC
HCT: 45.5 % (ref 39.0–52.0)
Hemoglobin: 15.6 g/dL (ref 13.0–17.0)
MCH: 28.5 pg (ref 26.0–34.0)
MCHC: 34.3 g/dL (ref 30.0–36.0)
MCV: 83.2 fL (ref 80.0–100.0)
Platelets: 183 10*3/uL (ref 150–400)
RBC: 5.47 MIL/uL (ref 4.22–5.81)
RDW: 14.3 % (ref 11.5–15.5)
WBC: 20.9 10*3/uL — ABNORMAL HIGH (ref 4.0–10.5)
nRBC: 0 % (ref 0.0–0.2)

## 2020-02-26 LAB — OSMOLALITY, URINE: Osmolality, Ur: 1004 mOsm/kg — ABNORMAL HIGH (ref 300–900)

## 2020-02-26 LAB — PROTIME-INR
INR: 1 (ref 0.8–1.2)
Prothrombin Time: 13.2 seconds (ref 11.4–15.2)

## 2020-02-26 LAB — OSMOLALITY: Osmolality: 269 mOsm/kg — ABNORMAL LOW (ref 275–295)

## 2020-02-26 LAB — SODIUM, URINE, RANDOM: Sodium, Ur: <10 mmol/L

## 2020-02-26 MED ORDER — ADULT MULTIVITAMIN W/MINERALS CH: 1.0000 | ORAL_TABLET | Freq: Every day | ORAL | 1 refills | Status: AC

## 2020-02-26 MED ORDER — LISINOPRIL 20 MG PO TABS: 20.0000 mg | ORAL_TABLET | Freq: Every day | ORAL | 0 refills | Status: AC

## 2020-02-26 MED ORDER — FOLIC ACID 1 MG PO TABS: 1.0000 mg | ORAL_TABLET | Freq: Every day | ORAL | 0 refills | Status: DC

## 2020-02-26 MED ORDER — THIAMINE HCL 100 MG PO TABS: 100.0000 mg | ORAL_TABLET | Freq: Every day | ORAL | 1 refills | Status: AC

## 2020-02-26 MED ORDER — OXYCODONE HCL 5 MG PO TABS
5.0000 mg | ORAL_TABLET | Freq: Four times a day (QID) | ORAL | Status: DC | PRN
  Administered 2020-02-26: 5 mg via ORAL
  Filled 2020-02-26: qty 1

## 2020-02-26 MED ORDER — METOPROLOL TARTRATE 25 MG PO TABS: 12.5000 mg | ORAL_TABLET | Freq: Two times a day (BID) | ORAL | 1 refills | Status: AC

## 2020-02-26 NOTE — Discharge Summary (Signed)
Physician Discharge Summary  Christopher Norton Parades WJX:[id number] DOB: 06-20-1966 DOA: 02/23/2020  PCP: Patient, No Pcp Per  Admit date: 02/23/2020 Discharge date: 02/26/2020  Admitted From: Home Disposition:  Home  Recommendations for Outpatient Follow-up:  1. Follow up with PCP in 1-2 weeks 2. Please obtain CMP/CBC in one week 3. Please follow up on the following pending results:None  Home Health:No Equipment/Devices:None Discharge Condition: Stable CODE STATUS: Full Diet recommendation: Heart Healthy   Brief/Interim Summary: Christopher Norton a 53 y.o.malewith medical history significant ofAlcohol abuse seen in ed for abdominal pain that started 3 days ago and has progressively gotten worse. Pt states His abd pain is left side of abdomen. has had psoriasis for 9 years, does not have dermatologist. Patient initially started with intermittent pain which is more consistent now associated with nausea and vomiting. Has a longstanding history of alcohol abuse, he wants to quit as his wife threatened to leave him if he continued to drink. Has an history of becoming shaky if he does not drink.  He was placed on CIWA protocol.  Did not show much signs of withdrawal.  Patient lipase was elevated and 63 with elevated liver enzymes.  Acute hepatitis panel was negative.  Most likely secondary to alcoholic pancreatitis.  His Madrey discrimination score was 6 initially which increased to 8 due to increase in bilirubin.  No recommendations for steroid at this time.   Liver enzymes started improving except T bili.  Clinically seems improving.  No nausea or vomiting.  Able to tolerate diet.  Patient was counseled extensively against alcohol used.  We would like to keep patient for another day to see a downward trend on T bili.  Patient requested discharge stating that he will have a close follow-up and it is very important for him to go home now.  He was feeling better.  He was  discharged home with instructions to have a close follow-up with PCP and a gastroenterologist.  Patient also has an history of hypertension.  Apparently was not taking his home antihypertensives as he ran out of them.  No insurance.  He was restarted on his home dose of metoprolol and lisinopril with some improvement in blood pressure.  His lisinopril was increased to 20 mg daily.  He will need a PCP and a close follow-up for further management.  Patient also has scaly multiple skin lesions involving all extremities and abdomen more consistent with psoriasis.  Per patient he has a diagnosis of psoriasis but does not follow-up with any physician.  He was using some home remedies.  He was advised to follow-up with a rheumatologist for further management.  Discharge Diagnoses:  Active Problems:   Abdominal pain   Alcoholic hepatitis   Alcohol-induced acute pancreatitis  Discharge Instructions  Discharge Instructions    Diet - low sodium heart healthy   Complete by: As directed    Discharge instructions   Complete by: As directed    It was pleasure taking care of you. As we discussed your liver enzymes called bilirubin is trending up, most likely due to your alcohol use as all of other testing was normal.  Please avoid alcohol and have a close follow-up with a primary care physician.  You need to have your liver enzyme being tested in the next few days. Your sodium was little low today, looks like you are little dehydrated and your liver is also affecting your levels. You also need to follow-up with a gastroenterologist. You need  to be get established with a rheumatologist for your psoriasis. Continue taking your blood pressure medications and supplement, eat healthy and keep yourself well-hydrated.   Increase activity slowly   Complete by: As directed      Allergies as of 02/26/2020   No Known Allergies     Medication List    STOP taking these medications   acetaminophen 325 MG  tablet Commonly known as: TYLENOL   benzonatate 100 MG capsule Commonly known as: Tessalon Perles   furosemide 40 MG tablet Commonly known as: LASIX     TAKE these medications   cetirizine 10 MG tablet Commonly known as: ZYRTEC Take 10 mg by mouth daily.   folic acid 1 MG tablet Commonly known as: FOLVITE Take 1 tablet (1 mg total) by mouth daily. Start taking on: February 27, 2020   lisinopril 20 MG tablet Commonly known as: ZESTRIL Take 1 tablet (20 mg total) by mouth daily.   metoprolol tartrate 25 MG tablet Commonly known as: LOPRESSOR Take 0.5 tablets (12.5 mg total) by mouth 2 (two) times daily.   multivitamin with minerals Tabs tablet Take 1 tablet by mouth daily. Start taking on: February 27, 2020   naproxen 500 MG tablet Commonly known as: NAPROSYN Take 1 tablet (500 mg total) by mouth 2 (two) times daily as needed   thiamine 100 MG tablet Take 1 tablet (100 mg total) by mouth daily. Start taking on: February 27, 2020       No Known Allergies  Consultations:  None  Procedures/Studies: CT ABDOMEN PELVIS W CONTRAST  Result Date: 02/24/2020 CLINICAL DATA:  54 year old male with abdominal pain. EXAM: CT ABDOMEN AND PELVIS WITH CONTRAST TECHNIQUE: Multidetector CT imaging of the abdomen and pelvis was performed using the standard protocol following bolus administration of intravenous contrast. CONTRAST:  OMNIPAQUE IOHEXOL 300 MG/ML  SOLN COMPARISON:  Right upper quadrant ultrasound dated 02/23/2020. FINDINGS: Lower chest: The visualized lung bases are clear. There is a small calcified granuloma at the right lung base. No intra-abdominal free air or free fluid. Hepatobiliary: Diffuse fatty liver. No intrahepatic biliary ductal dilatation. The gallbladder is unremarkable. Pancreas: There is inflammatory changes of the distal body and tail of the pancreas consistent with acute pancreatitis. Correlation with pancreatic enzymes recommended. No drainable fluid  collection/abscess or pseudocyst. Spleen: Normal in size without focal abnormality. Adrenals/Urinary Tract: The adrenal glands unremarkable. The kidneys, visualized ureters, and urinary bladder appear unremarkable. Stomach/Bowel: Mild diffuse thickened appearance of the colon, likely related to underdistention. Colitis is less likely. Clinical correlation is recommended. There is no bowel obstruction. The appendix is normal. Vascular/Lymphatic: The abdominal aorta and IVC are unremarkable. No portal venous gas. There is no adenopathy. Reproductive: The prostate and seminal vesicles are grossly unremarkable. Other: None Musculoskeletal: No acute or significant osseous findings. IMPRESSION: 1. Acute pancreatitis. No abscess or pseudocyst. 2. Fatty liver. 3. Underdistention of the colon versus less likely mild colitis. Clinical correlation is recommended. No bowel obstruction. Normal appendix. Electronically Signed   By: Elgie Collard M.D.   On: 02/24/2020 00:08   US Abdomen Limited RUQ  Result Date: 02/23/2020 CLINICAL DATA:  Initial evaluation for acute abdominal pain for 3 days. EXAM: ULTRASOUND ABDOMEN LIMITED RIGHT UPPER QUADRANT COMPARISON:  None. FINDINGS: Gallbladder: No gallstones or wall thickening visualized. No sonographic Murphy sign noted by sonographer. Common bile duct: Diameter: 3.4 mm Liver: No focal lesion identified. Liver demonstrates a diffusely dense echogenic echotexture. Portal vein is patent on color Doppler imaging with normal  direction of blood flow towards the liver. Other: None. IMPRESSION: 1. Dense echogenic echotexture throughout the liver, which can be seen with steatosis and/or other intrinsic hepatocellular disease. Correlation with LFTs suggested. 2. Normal sonographic evaluation of the gallbladder. No biliary dilatation. Electronically Signed   By: Rise Mu M.D.   On: 02/23/2020 19:16     Subjective: Patient was feeling better when seen today.  No more nausea  or vomiting.  No constipation.  Abdominal pain has been improved.  He really wants to go home.  We discussed the increase in his T bili and see if he can stay for another day to see a downward trend which he declined stating that he has important things to take care today and promised to follow-up closely with his providers.  Discharge Exam: Vitals:   02/26/20 0451 02/26/20 0639  BP: 131/89   Pulse: (!) 113 98  Resp: 20   Temp: 98.6 F (37 C)   SpO2: 96%    Vitals:   02/25/20 2006 02/26/20 0000 02/26/20 0451 02/26/20 0639  BP: (!) 132/95 111/72 131/89   Pulse: (!) 119 99 (!) 113 98  Resp: 20 20 20    Temp: 97.7 F (36.5 C) 98.9 F (37.2 C) 98.6 F (37 C)   TempSrc: Oral Oral Oral   SpO2: 93% 95% 96%   Weight:      Height:        General: Pt is alert, awake, not in acute distress Cardiovascular: RRR, S1/S2 +, no rubs, no gallops Respiratory: CTA bilaterally, no wheezing, no rhonchi Abdominal: Soft, NT, ND, bowel sounds + Extremities: no edema, no cyanosis   The results of significant diagnostics from this hospitalization (including imaging, microbiology, ancillary and laboratory) are listed below for reference.    Microbiology: Recent Results (from the past 240 hour(s))  SARS Coronavirus 2 by RT PCR (hospital order, performed in [facility] hospital lab) Nasopharyngeal Nasopharyngeal Swab     Status: None   Collection Time: 02/23/20  8:04 PM   Specimen: Nasopharyngeal Swab  Result Value Ref Range Status   SARS Coronavirus 2 NEGATIVE NEGATIVE Final    Comment: (NOTE) SARS-CoV-2 target nucleic acids are NOT DETECTED.  The SARS-CoV-2 RNA is generally detectable in upper and lower respiratory specimens during the acute phase of infection. The lowest concentration of SARS-CoV-2 viral copies this assay can detect is 250 copies / mL. A negative result does not preclude SARS-CoV-2 infection and should not be used as the sole basis for treatment or other patient management  decisions.  A negative result may occur with improper specimen collection / handling, submission of specimen other than nasopharyngeal swab, presence of viral mutation(s) within the areas targeted by this assay, and inadequate number of viral copies (<250 copies / mL). A negative result must be combined with clinical observations, patient history, and epidemiological information.  Fact Sheet for Patients:   02/25/20  Fact Sheet for Healthcare Providers: BoilerBrush.com.cy  This test is not yet approved or  cleared by the https://pope.com/ FDA and has been authorized for detection and/or diagnosis of SARS-CoV-2 by FDA under an Emergency Use Authorization (EUA).  This EUA will remain in effect (meaning this test can be used) for the duration of the COVID-19 declaration under Section 564(b)(1) of the Act, 21 U.S.C. section 360bbb-3(b)(1), unless the authorization is terminated or revoked sooner.  Performed at [facility], [address] [place], [place]      Labs: BNP (last 3 results) No  results for input(s): BNP in the last 8760 hours. Basic Metabolic Panel: Recent Labs  Lab 02/22/20 2322 02/23/20 1443 02/24/20 0618 02/25/20 0510 02/26/20 0405  NA  --  134* 129* 131* 124*  K  --  3.9 3.6 3.8 3.9  CL  --  98 94* 93* 88*  CO2  --  23 23 28 28   GLUCOSE  --  137* 133* 161* 147*  BUN  --  12 13 14 17   CREATININE  --  0.73 0.69 0.83 0.82  CALCIUM  --  8.2* 8.1* 8.3* 8.0*  MG 2.0  --   --   --   --   PHOS 3.7  --   --   --   --    Liver Function Tests: Recent Labs  Lab 02/23/20 1443 02/24/20 0618 02/25/20 0510 02/26/20 0405  AST 858* 488* 237* 185*  ALT 557* 434* 297* 203*  ALKPHOS 254* 226* 222* 201*  BILITOT 2.4* 5.6* 5.2* 8.1*  PROT 7.3 6.9 7.4 6.7  ALBUMIN 3.7 3.4* 3.3* 2.9*   Recent Labs  Lab 02/23/20 1443  LIPASE 63*   No results for input(s): AMMONIA in the last 168  hours. CBC: Recent Labs  Lab 02/23/20 1443 02/23/20 2322 02/24/20 0618 02/25/20 0510 02/26/20 0405  WBC 10.7* 17.9* 14.7* 27.7* 20.9*  HGB 18.8* 17.8* 17.5* 17.4* 15.6  HCT 53.3* 51.4 50.5 49.9 45.5  MCV 81.3 82.1 82.4 82.6 83.2  PLT 322 331 291 241 183   Cardiac Enzymes: No results for input(s): CKTOTAL, CKMB, CKMBINDEX, TROPONINI in the last 168 hours. BNP: Invalid input(s): POCBNP CBG: No results for input(s): GLUCAP in the last 168 hours. D-Dimer No results for input(s): DDIMER in the last 72 hours. Hgb A1c Recent Labs    02/23/20 2024  HGBA1C 6.7*   Lipid Profile No results for input(s): CHOL, HDL, LDLCALC, TRIG, CHOLHDL, LDLDIRECT in the last 72 hours. Thyroid function studies No results for input(s): TSH, T4TOTAL, T3FREE, THYROIDAB in the last 72 hours.  Invalid input(s): FREET3 Anemia work up No results for input(s): VITAMINB12, FOLATE, FERRITIN, TIBC, IRON, RETICCTPCT in the last 72 hours. Urinalysis    Component Value Date/Time   COLORURINE YELLOW (A) 02/23/2020 1443   APPEARANCEUR CLEAR (A) 02/23/2020 1443   LABSPEC 1.020 02/23/2020 1443   PHURINE 7.0 02/23/2020 1443   GLUCOSEU NEGATIVE 02/23/2020 1443   HGBUR SMALL (A) 02/23/2020 1443   BILIRUBINUR NEGATIVE 02/23/2020 1443   KETONESUR NEGATIVE 02/23/2020 1443   PROTEINUR 30 (A) 02/23/2020 1443   NITRITE NEGATIVE 02/23/2020 1443   LEUKOCYTESUR NEGATIVE 02/23/2020 1443   Sepsis Labs Invalid input(s): PROCALCITONIN,  WBC,  LACTICIDVEN Microbiology Recent Results (from the past 240 hour(s))  SARS Coronavirus 2 by RT PCR (hospital order[facility]) Nasopharyngeal Nasopharyngeal Swab     Status: None   Collection Time: 02/23/20  8:04 PM   Specimen: Nasopharyngeal Swab  Result Value Ref Range Status   SARS Coronavirus 2 NEGATIVE NEGATIVE Final    Comment: (NOTE) SARS-CoV-2 target nucleic acids are NOT DETECTED.  The SARS-CoV-2 RNA is generally detectable in upper and  lower respiratory specimens during the acute phase of infection. The lowest concentration of SARS-CoV-2 viral copies this assay can detect is 250 copies / mL. A negative result does not preclude SARS-CoV-2 infection and should not be used as the sole basis for treatment or other patient management decisions.  A negative result may occur with improper specimen collection / handling, submission of  specimen other than nasopharyngeal swab, presence of viral mutation(s) within the areas targeted by this assay, and inadequate number of viral copies (<250 copies / mL). A negative result must be combined with clinical observations, patient history, and epidemiological information.  Fact Sheet for Patients:   BoilerBrush.com.cy  Fact Sheet for Healthcare Providers: https://pope.com/  This test is not yet approved or  cleared by the [place] FDA and has been authorized for detection and/or diagnosis of SARS-CoV-2 by FDA under an Emergency Use Authorization (EUA).  This EUA will remain in effect (meaning this test can be used) for the duration of the COVID-19 declaration under Section 564(b)(1) of the Act, 21 U.S.C. section 360bbb-3(b)(1), unless the authorization is terminated or revoked sooner.  Performed at [facility], [address] [place], [place] [address]     Time coordinating discharge: Over 30 minutes  SIGNED:  Arnetha Courser, MD  [facility] 02/26/2020, 12:09 PM  If 7PM-7AM, please contact night-coverage [email or web address]  This record has been created using Conservation officer, historic buildings. Errors have been sought and corrected,but may not always be located. Such creation errors do not reflect on the standard of care.

## 2020-02-26 NOTE — Discharge Instructions (Signed)
Hepatitis alcohlica Alcoholic Hepatitis  La hepatitis alcohlica es una inflamacin del hgado causada por beber alcohol en exceso durante un perodo prolongado. Esta inflamacin disminuye la capacidad del hgado de funcionar normalmente. Las personas que tienen esta afeccin deben dejar de tomar alcohol de modo permanente para evitar ms dao. Cules son las causas? La hepatitis alcohlica es causada por el consumo excesivo de alcohol durante mucho tiempo (crnico). El hgado filtra el alcohol del torrente sanguneo. Cuando el alcohol se divide (descompone) en pequeas partculas en el hgado, se producen ciertas sustancias que pueden daar las clulas hepticas. Esto causa la destruccin de las clulas hepticas e inflamacin. Qu incrementa el riesgo? Los siguientes factores pueden hacer que usted sea ms propenso a tener esta afeccin:  Product manager regularmente grandes cantidades de alcohol, especialmente en un perodo de tiempo corto (borrachera).  Consumir alcohol excesivamente durante aos.  Ser mujer.  Ser obeso.  Haber tenido una infeccin por hepatitis en el pasado.  Tener un problema en el hgado desde el nacimiento (enfermedad heptica gentica).  Tener una falta (deficiencia) de ciertos nutrientes, como folato o tiamina.  Tener a la [place], el padre o un hermano con hepatitis alcohlica. Cules son los signos o sntomas? Los sntomas de esta afeccin incluyen los siguientes:  Dolor e hinchazn en el abdomen.  Prdida del apetito.  Perder peso sin proponrselo.  Nuseas y vmitos.  Diarrea.  Grant Ruts.  Fatiga.  Coloracin amarillenta de la piel y las partes blancas de los ojos (ictericia).  Venas que se pueden ver ("araitas"), especialmente en el abdomen.  Sangrar con facilidad, por ejemplo, tener sangrado excesivo de un corte pequeo.  Picazn.  Dificultad para pensar claramente.  Problemas de memoria.  Cambios en el humor.  Confusin. Cmo se  diagnostica? Esta afeccin se puede diagnosticar mediante:  Un examen fsico y Neomia Dear revisin de los antecedentes mdicos.  Anlisis de sangre para controlar la funcin heptica.  Estudios que generan imgenes detalladas del cuerpo. Estos pueden incluir lo siguiente: ? Ecografa del hgado. ? Exploracin por tomografa computarizada (TC). ? Resonancia magntica (RM).  Biopsia de hgado. En [employer], se extrae una pequea [place] de tejido heptico y se examina para buscar signos de dao en el hgado. Cmo se trata? La parte mas importante del tratamiento es dejar de consumir alcohol. Si es adicto al alcohol, el mdico lo ayudar a Doctor, hospital plan para dejarlo. Ese plan puede implicar lo siguiente:  Tomar medicamentos para aliviar los sntomas desagradables que se producen al dejar el alcohol o disminuir su consumo (sndrome de abstinencia).  Entrar a un programa de tratamiento que lo ayude a dejar de beber.  Unirse a un grupo de apoyo. El tratamiento para la hepatitis alcohlica tambin puede incluir lo siguiente:  Medicamentos con corticoesteroides para Social research officer, government.  Terapia nutricional. El mdico o un especialista en dietas y alimentacin (nutricionista) puede recomendarle lo siguiente: ? Consumir una dieta saludable. ? Comer alimentos especficos que contienen vitaminas y minerales para ayudarlo a Pharmacologist los niveles de nutrientes en el cuerpo. ? Tomar vitaminas y suplementos alimenticios para asegurarse de [employer] de nutrientes en el cuerpo.  Recibir un hgado donado (trasplante de hgado). Esto solo se hace en casos muy graves y Public relations account executive en las personas que han dejado de beber de [place] y pueden comprometerse a no volver a beber alcohol nunca ms. Siga estas indicaciones en su casa:   No beba alcohol. Siga su plan de tratamiento y consulte con su mdico segn  sea necesario.  Considere la posibilidad de Advertising account planner en un grupo de apoyo  para Actor. Estos grupos pueden brindar apoyo emocional y orientacin.  Tome los medicamentos de venta libre y los recetados solamente como se lo haya indicado el mdico. Estos incluyen las vitaminas y los suplementos.  No use medicamentos ni consuma alimentos que contengan alcohol a menos que se lo indique el mdico.  Siga las indicaciones del mdico o del nutricionista respecto de la terapia nutricional.  Concurra a todas las visitas de control como se lo haya indicado el mdico. Esto es importante. Comunquese con un mdico si:  Tiene fiebre.  Tiene menos apetito.  Tiene sntomas similares a los de la gripe, como fatiga, debilidad o dolores musculares.  Tiene nuseas o vmitos.  Le aparecen moretones con facilidad.  La orina es [address]  Siente un dolor nuevo en el abdomen. Solicite ayuda de inmediato si:  Vomita sangre.  Tiene ictericia.  Tiene picazn fuerte en la piel.  Se le hinchan las piernas.  El abdomen se le hincha repentinamente.  Las heces son negras, alquitranadas o sanguinolentas.  Sangra con facilidad, por ejemplo, tiene sangrado excesivo de un corte pequeo.  Est confundido o no piensa con claridad.  Tiene una convulsin. Resumen  La hepatitis alcohlica es una inflamacin del hgado causada por beber alcohol en exceso durante un perodo prolongado.  La hepatitis alcohlica se diagnostica con anlisis de sangre para comprobar el funcionamiento del hgado.  La parte mas importante del tratamiento es dejar de consumir alcohol. Siga su plan de tratamiento y consulte con su mdico segn sea necesario. Esta informacin no tiene Theme park manager el consejo del mdico. Asegrese de hacerle al mdico cualquier pregunta que tenga. Document Revised: 06/23/2017 Document Reviewed: 06/23/2017 Elsevier Patient Education  2020 Elsevier Inc.   Enfermedad heptica por alcoholismo Alcoholic Liver Disease  La enfermedad heptica por  alcoholismo es el dao heptico causado por el consumo de gran cantidad de alcohol durante un largo perodo de [place]. Si sufre esta enfermedad, debe dejar de beber alcohol. Siga estas indicaciones en su casa:   No beba alcohol. Siga el plan de tratamiento. Colabore con el mdico si necesita ayuda.  Considere la posibilidad de unirse a un grupo de apoyo por alcoholismo.  Tome los medicamentos de venta libre y los recetados solamente como se lo haya indicado el mdico. Estos incluyen las vitaminas.  No tome medicamentos ni consuma alimentos que contengan alcohol, a menos que su mdico le diga que es seguro.  Siga las indicaciones del mdico respecto de seguir una dieta saludable.  Concurra a todas las visitas de [address] se lo haya indicado el mdico. Esto es importante. Comunquese con un mdico si:  Le sube la fiebre.  Su piel comienza a ponerse ms amarillenta, plida u oscura.  Tiene dolores de [place]. Solicite ayuda de inmediato si:  Vomita sangre.  Observa sangre de color rojo brillante en las heces (materia fecal).  Su materia fecal es de color negro o similar al alquitrn.  Tiene dificultad para hacer lo siguiente: ? Razonar. ? Caminar. ? Mantener el equilibrio. ? Respirar. Resumen  La enfermedad heptica por alcoholismo es el dao heptico causado por el consumo de gran cantidad de alcohol durante un largo perodo de [place].  Si sufre esta enfermedad, debe dejar de beber alcohol. Siga el plan de tratamiento, y colabore con su mdico segn sea necesario.  Considere la posibilidad de unirse a un grupo de apoyo por alcoholismo. Esta  informacin no tiene Theme park manager el consejo del mdico. Asegrese de hacerle al mdico cualquier pregunta que tenga. Document Revised: 08/06/2017 Document Reviewed: 08/06/2017 Elsevier Patient Education  2020 [employer].

## 2020-02-26 NOTE — Progress Notes (Signed)
Christopher Norton Parades to be D/C'd hom per MD order.  Discussed prescriptions and follow up appointments with the patient. Prescriptions given to patient, medication list explained in detail. Explained that medication management is not opened on the weekends. Asked patient to wait until healthcare team could get him medication to last him until Monday. Patient refused and said he needed to go home now. Patient said he would wait until Monday to get his BP medications. Nurse explained that it is important to take them this weekend  but Pt verbalized understanding and said he just couldn't wait and needed to go home. Patient left without a way to get his blood pressure medication tomorrow.  Allergies as of 02/26/2020   No Known Allergies      Medication List     STOP taking these medications    acetaminophen 325 MG tablet Commonly known as: TYLENOL   benzonatate 100 MG capsule Commonly known as: Tessalon Perles   furosemide 40 MG tablet Commonly known as: LASIX       TAKE these medications    cetirizine 10 MG tablet Commonly known as: ZYRTEC Take 10 mg by mouth daily.   folic acid 1 MG tablet Commonly known as: FOLVITE Take 1 tablet (1 mg total) by mouth daily. Start taking on: February 27, 2020   lisinopril 20 MG tablet Commonly known as: ZESTRIL Take 1 tablet (20 mg total) by mouth daily.   metoprolol tartrate 25 MG tablet Commonly known as: LOPRESSOR Take 0.5 tablets (12.5 mg total) by mouth 2 (two) times daily.   multivitamin with minerals Tabs tablet Take 1 tablet by mouth daily. Start taking on: February 27, 2020   naproxen 500 MG tablet Commonly known as: NAPROSYN Take 1 tablet (500 mg total) by mouth 2 (two) times daily as needed   thiamine 100 MG tablet Take 1 tablet (100 mg total) by mouth daily. Start taking on: February 27, 2020        Vitals:   02/26/20 0451 02/26/20 0639  BP: 131/89   Pulse: (!) 113 98  Resp: 20   Temp: 98.6 F (37 C)   SpO2: 96%      Skin clean, dry and intact without evidence of skin break down, no evidence of skin tears noted. IV catheter discontinued intact. Site without signs and symptoms of complications. Dressing and pressure applied. Pt denies pain at this time. No complaints noted.  An After Visit Summary was printed and given to the patient. Patient escorted via WC, and D/C home via private auto.  Cynde Menard A Kelia Gibbon

## 2020-02-27 ENCOUNTER — Inpatient Hospital Stay
Admission: EM | Admit: 2020-02-27 | Discharge: 2020-02-29 | DRG: 441 | Disposition: A | Discharge status: Home / Self Care | Payer: Self-pay | Attending: Internal Medicine | Admitting: Internal Medicine

## 2020-02-27 ENCOUNTER — Emergency Department: Payer: Self-pay

## 2020-02-27 ENCOUNTER — Other Ambulatory Visit: Payer: Self-pay

## 2020-02-27 ENCOUNTER — Encounter: Payer: Self-pay | Admitting: Emergency Medicine

## 2020-02-27 DIAGNOSIS — E860 Dehydration: Secondary | ICD-10-CM | POA: Diagnosis present

## 2020-02-27 DIAGNOSIS — R52 Pain, unspecified: Secondary | ICD-10-CM

## 2020-02-27 DIAGNOSIS — D735 Infarction of spleen: Secondary | ICD-10-CM | POA: Diagnosis present

## 2020-02-27 DIAGNOSIS — K567 Ileus, unspecified: Secondary | ICD-10-CM | POA: Diagnosis present

## 2020-02-27 DIAGNOSIS — Z20822 Contact with and (suspected) exposure to covid-19: Secondary | ICD-10-CM | POA: Diagnosis present

## 2020-02-27 DIAGNOSIS — K760 Fatty (change of) liver, not elsewhere classified: Secondary | ICD-10-CM | POA: Diagnosis present

## 2020-02-27 DIAGNOSIS — Z8249 Family history of ischemic heart disease and other diseases of the circulatory system: Secondary | ICD-10-CM

## 2020-02-27 DIAGNOSIS — F1010 Alcohol abuse, uncomplicated: Secondary | ICD-10-CM | POA: Diagnosis present

## 2020-02-27 DIAGNOSIS — Z1389 Encounter for screening for other disorder: Secondary | ICD-10-CM

## 2020-02-27 DIAGNOSIS — I81 Portal vein thrombosis: Principal | ICD-10-CM | POA: Diagnosis present

## 2020-02-27 DIAGNOSIS — E871 Hypo-osmolality and hyponatremia: Secondary | ICD-10-CM | POA: Diagnosis present

## 2020-02-27 DIAGNOSIS — R109 Unspecified abdominal pain: Secondary | ICD-10-CM | POA: Diagnosis present

## 2020-02-27 DIAGNOSIS — L409 Psoriasis, unspecified: Secondary | ICD-10-CM | POA: Diagnosis present

## 2020-02-27 DIAGNOSIS — D72829 Elevated white blood cell count, unspecified: Secondary | ICD-10-CM | POA: Diagnosis present

## 2020-02-27 DIAGNOSIS — K7010 Alcoholic hepatitis without ascites: Secondary | ICD-10-CM | POA: Diagnosis present

## 2020-02-27 DIAGNOSIS — E876 Hypokalemia: Secondary | ICD-10-CM | POA: Diagnosis present

## 2020-02-27 DIAGNOSIS — I82890 Acute embolism and thrombosis of other specified veins: Secondary | ICD-10-CM | POA: Diagnosis present

## 2020-02-27 DIAGNOSIS — Z79899 Other long term (current) drug therapy: Secondary | ICD-10-CM

## 2020-02-27 DIAGNOSIS — K852 Alcohol induced acute pancreatitis without necrosis or infection: Secondary | ICD-10-CM | POA: Diagnosis present

## 2020-02-27 DIAGNOSIS — R17 Unspecified jaundice: Secondary | ICD-10-CM

## 2020-02-27 DIAGNOSIS — I10 Essential (primary) hypertension: Secondary | ICD-10-CM | POA: Diagnosis present

## 2020-02-27 DIAGNOSIS — Z87891 Personal history of nicotine dependence: Secondary | ICD-10-CM

## 2020-02-27 DIAGNOSIS — D6859 Other primary thrombophilia: Secondary | ICD-10-CM | POA: Diagnosis present

## 2020-02-27 LAB — BASIC METABOLIC PANEL
Anion gap: 8 (ref 5–15)
BUN: 14 mg/dL (ref 6–20)
CO2: 29 mmol/L (ref 22–32)
Calcium: 8 mg/dL — ABNORMAL LOW (ref 8.9–10.3)
Chloride: 93 mmol/L — ABNORMAL LOW (ref 98–111)
Creatinine, Ser: 0.75 mg/dL (ref 0.61–1.24)
GFR calc Af Amer: >60 mL/min (ref >60)
GFR calc non Af Amer: >60 mL/min (ref >60)
Glucose, Bld: 113 mg/dL — ABNORMAL HIGH (ref 70–99)
Potassium: 3.4 mmol/L — ABNORMAL LOW (ref 3.5–5.1)
Sodium: 130 mmol/L — ABNORMAL LOW (ref 135–145)

## 2020-02-27 LAB — SARS CORONAVIRUS 2 BY RT PCR (HOSPITAL ORDER, PERFORMED IN ~~LOC~~ HOSPITAL LAB): SARS Coronavirus 2: NEGATIVE

## 2020-02-27 LAB — URINALYSIS, COMPLETE (UACMP) WITH MICROSCOPIC
Bacteria, UA: NONE SEEN
Bilirubin Urine: NEGATIVE
Glucose, UA: NEGATIVE mg/dL
Ketones, ur: 5 mg/dL — AB
Leukocytes,Ua: NEGATIVE
Nitrite: NEGATIVE
Protein, ur: NEGATIVE mg/dL
Specific Gravity, Urine: 1.017 (ref 1.005–1.030)
pH: 6 (ref 5.0–8.0)

## 2020-02-27 LAB — CBC
HCT: 43.7 % (ref 39.0–52.0)
Hemoglobin: 14.9 g/dL (ref 13.0–17.0)
MCH: 28.5 pg (ref 26.0–34.0)
MCHC: 34.1 g/dL (ref 30.0–36.0)
MCV: 83.6 fL (ref 80.0–100.0)
Platelets: 217 10*3/uL (ref 150–400)
RBC: 5.23 MIL/uL (ref 4.22–5.81)
RDW: 14.2 % (ref 11.5–15.5)
WBC: 16.9 10*3/uL — ABNORMAL HIGH (ref 4.0–10.5)
nRBC: 0 % (ref 0.0–0.2)

## 2020-02-27 LAB — COMPREHENSIVE METABOLIC PANEL
ALT: 189 U/L — ABNORMAL HIGH (ref 0–44)
AST: 173 U/L — ABNORMAL HIGH (ref 15–41)
Albumin: 3 g/dL — ABNORMAL LOW (ref 3.5–5.0)
Alkaline Phosphatase: 261 U/L — ABNORMAL HIGH (ref 38–126)
Anion gap: 9 (ref 5–15)
BUN: 15 mg/dL (ref 6–20)
CO2: 26 mmol/L (ref 22–32)
Calcium: 7.9 mg/dL — ABNORMAL LOW (ref 8.9–10.3)
Chloride: 88 mmol/L — ABNORMAL LOW (ref 98–111)
Creatinine, Ser: 0.72 mg/dL (ref 0.61–1.24)
GFR calc Af Amer: >60 mL/min (ref >60)
GFR calc non Af Amer: >60 mL/min (ref >60)
Glucose, Bld: 105 mg/dL — ABNORMAL HIGH (ref 70–99)
Potassium: 3.6 mmol/L (ref 3.5–5.1)
Sodium: 123 mmol/L — ABNORMAL LOW (ref 135–145)
Total Bilirubin: 10.2 mg/dL — ABNORMAL HIGH (ref 0.3–1.2)
Total Protein: 7.6 g/dL (ref 6.5–8.1)

## 2020-02-27 LAB — TYPE AND SCREEN
ABO/RH(D): O POS
Antibody Screen: NEGATIVE

## 2020-02-27 LAB — BILIRUBIN, DIRECT: Bilirubin, Direct: 5.2 mg/dL — ABNORMAL HIGH (ref 0.0–0.2)

## 2020-02-27 LAB — OSMOLALITY: Osmolality: 275 mOsm/kg (ref 275–295)

## 2020-02-27 LAB — ETHANOL: Alcohol, Ethyl (B): <10 mg/dL (ref <10)

## 2020-02-27 LAB — AMMONIA: Ammonia: 26 umol/L (ref 9–35)

## 2020-02-27 LAB — LIPASE, BLOOD: Lipase: 43 U/L (ref 11–51)

## 2020-02-27 LAB — OSMOLALITY, URINE: Osmolality, Ur: 312 mOsm/kg (ref 300–900)

## 2020-02-27 LAB — PROTIME-INR
INR: 1.1 (ref 0.8–1.2)
Prothrombin Time: 13.6 seconds (ref 11.4–15.2)

## 2020-02-27 LAB — SODIUM, URINE, RANDOM: Sodium, Ur: <10 mmol/L

## 2020-02-27 MED ORDER — HYDROMORPHONE HCL 1 MG/ML IJ SOLN
1.0000 mg | INTRAMUSCULAR | Status: DC | PRN
  Administered 2020-02-27 – 2020-02-29 (×5): 1 mg via INTRAVENOUS
  Filled 2020-02-27 (×5): qty 1

## 2020-02-27 MED ORDER — SODIUM CHLORIDE 0.9 % IV BOLUS
500.0000 mL | Freq: Once | INTRAVENOUS | Status: AC
  Administered 2020-02-27: 500 mL via INTRAVENOUS

## 2020-02-27 MED ORDER — HEPARIN (PORCINE) 25000 UT/250ML-% IV SOLN
2350.0000 [IU]/h | INTRAVENOUS | Status: DC
  Administered 2020-02-27: 1500 [IU]/h via INTRAVENOUS
  Administered 2020-02-28: 1650 [IU]/h via INTRAVENOUS
  Administered 2020-02-28: 2350 [IU]/h via INTRAVENOUS
  Filled 2020-02-27 (×4): qty 250

## 2020-02-27 MED ORDER — HYDRALAZINE HCL 20 MG/ML IJ SOLN: 5.0000 mg | INTRAMUSCULAR | Status: DC | PRN

## 2020-02-27 MED ORDER — HYDROMORPHONE HCL 1 MG/ML IJ SOLN
0.5000 mg | Freq: Once | INTRAMUSCULAR | Status: AC
  Administered 2020-02-27: 0.5 mg via INTRAVENOUS
  Filled 2020-02-27: qty 1

## 2020-02-27 MED ORDER — THIAMINE HCL 100 MG PO TABS
100.0000 mg | ORAL_TABLET | Freq: Every day | ORAL | Status: DC
  Administered 2020-02-27 – 2020-02-29 (×3): 100 mg via ORAL
  Filled 2020-02-27 (×3): qty 1

## 2020-02-27 MED ORDER — ONDANSETRON HCL 4 MG/2ML IJ SOLN: 4.0000 mg | Freq: Three times a day (TID) | INTRAMUSCULAR | Status: DC | PRN

## 2020-02-27 MED ORDER — IOHEXOL 300 MG/ML  SOLN
75.0000 mL | Freq: Once | INTRAMUSCULAR | Status: AC | PRN
  Administered 2020-02-27: 100 mL via INTRAVENOUS

## 2020-02-27 MED ORDER — ONDANSETRON HCL 4 MG/2ML IJ SOLN
4.0000 mg | Freq: Once | INTRAMUSCULAR | Status: AC
  Administered 2020-02-27: 4 mg via INTRAVENOUS
  Filled 2020-02-27: qty 2

## 2020-02-27 MED ORDER — FOLIC ACID 1 MG PO TABS
1.0000 mg | ORAL_TABLET | Freq: Every day | ORAL | Status: DC
  Administered 2020-02-27 – 2020-02-29 (×3): 1 mg via ORAL
  Filled 2020-02-27 (×3): qty 1

## 2020-02-27 MED ORDER — SODIUM CHLORIDE 0.9 % IV SOLN: INTRAVENOUS | Status: DC

## 2020-02-27 MED ORDER — SENNOSIDES-DOCUSATE SODIUM 8.6-50 MG PO TABS: 1.0000 | ORAL_TABLET | Freq: Every evening | ORAL | Status: DC | PRN

## 2020-02-27 MED ORDER — METOPROLOL TARTRATE 25 MG PO TABS
12.5000 mg | ORAL_TABLET | Freq: Two times a day (BID) | ORAL | Status: DC
  Administered 2020-02-27 – 2020-02-29 (×4): 12.5 mg via ORAL
  Filled 2020-02-27 (×4): qty 1

## 2020-02-27 MED ORDER — ADULT MULTIVITAMIN W/MINERALS CH
1.0000 | ORAL_TABLET | Freq: Every day | ORAL | Status: DC
  Administered 2020-02-27 – 2020-02-29 (×3): 1 via ORAL
  Filled 2020-02-27 (×3): qty 1

## 2020-02-27 MED ORDER — HEPARIN BOLUS VIA INFUSION
5400.0000 [IU] | Freq: Once | INTRAVENOUS | Status: AC
  Administered 2020-02-27: 5400 [IU] via INTRAVENOUS
  Filled 2020-02-27: qty 5400

## 2020-02-27 MED ORDER — GADOBUTROL 1 MMOL/ML IV SOLN
10.0000 mL | Freq: Once | INTRAVENOUS | Status: AC | PRN
  Administered 2020-02-27: 10 mL via INTRAVENOUS

## 2020-02-27 NOTE — ED Provider Notes (Signed)
[facility] Emergency Department Provider Note  ____________________________________________   First MD Initiated Contact with Patient 02/27/20 [phone]     (approximate)  I have reviewed the triage vital signs and the nursing notes.   HISTORY  Chief Complaint Abdominal Pain    HPI Christopher Norton is a 54 y.o. male with alcohol abuse who comes in for abdominal pain.  Patient was discharged to the hospital today.  States he was told that his liver enzymes were elevated.  He reports that when he was discharged he started having swelling in his abdomen and increasing pain.  Patient denies drinking since discharge.  He states that since discharge his abdominal pain is gotten worse, severe, and over his entire abdomen, nothing makes better, nothing makes it worse.  He states that he was told that if he had worsening symptoms to come back to the emergency room for readmission.  He states that he has not been able to eat anything other than a little bit of Jell-O and juice.   On review of records during admission patient elevated lipase to 63 and elevated liver enzymes.  His acute hepatitis panel was negative.  His symptoms secondary to alcoholic pancreatitis.  He was told to follow-up with GI.  To note patient was offered Spanish interpreter but declined            Past Medical History:  Diagnosis Date   Abdominal pain    Alcohol abuse    Hypertension    Psoriasis     Patient Active Problem List   Diagnosis Date Noted   Alcoholic hepatitis    Alcohol-induced acute pancreatitis    Abdominal pain 02/23/2020   Acute renal failure (ARF) (HCC) 03/07/2017    History reviewed. No pertinent surgical history.  Prior to Admission medications   Medication Sig Start Date End Date Taking? Authorizing Provider  cetirizine (ZYRTEC) 10 MG tablet Take 10 mg by mouth daily.    [provider]  folic acid (FOLVITE) 1 MG tablet Take 1  tablet (1 mg total) by mouth daily. 02/27/20   Arnetha Courser, MD  lisinopril (ZESTRIL) 20 MG tablet Take 1 tablet (20 mg total) by mouth daily. 02/26/20   Arnetha Courser, MD  metoprolol tartrate (LOPRESSOR) 25 MG tablet Take 0.5 tablets (12.5 mg total) by mouth 2 (two) times daily. 02/26/20   Arnetha Courser, MD  Multiple Vitamin (MULTIVITAMIN WITH MINERALS) TABS tablet Take 1 tablet by mouth daily. 02/27/20   Arnetha Courser, MD  naproxen (NAPROSYN) 500 MG tablet Take 1 tablet (500 mg total) by mouth 2 (two) times daily as needed    [provider]  thiamine 100 MG tablet Take 1 tablet (100 mg total) by mouth daily. 02/27/20   Arnetha Courser, MD    Allergies Patient has no known allergies.  Family History  Problem Relation Age of Onset   Hypertension Father    Hypertension Sister    Hypertension Brother     Social History Social History   Tobacco Use   Smoking status: Former Smoker    Types: Cigarettes    Quit date: 08/09/2016    Years since quitting: 3.5   Smokeless tobacco: Never Used  Vaping Use   Vaping Use: Never assessed  Substance Use Topics   Alcohol use: Yes    Alcohol/week: 12.0 standard drinks    Types: 12 Cans of beer per week   Drug use: No      Review of Systems Constitutional:  No fever/chills Eyes: No visual changes. ENT: No sore throat. Cardiovascular: Denies chest pain. Respiratory: Denies shortness of breath. Gastrointestinal: Positive abdominal pain, nausea, vomiting, not eating Genitourinary: Negative for dysuria. Musculoskeletal: Negative for back pain. Skin: Negative for rash. Neurological: Negative for headaches, focal weakness or numbness. All other ROS negative ____________________________________________   PHYSICAL EXAM:  VITAL SIGNS: ED Triage Vitals [02/27/20 0340]  Enc Vitals Group     BP 121/79     Pulse Rate (!) 104     Resp 18     Temp 98.3 F (36.8 C)     Temp Source Oral     SpO2 93 %     Weight 248 lb (112.5  kg)     Height 5\' 6"  (1.676 m)     Head Circumference      Peak Flow      Pain Score 7     Pain Loc      Pain Edu?      Excl. in GC?     Constitutional: Alert and oriented. Well appearing and in no acute distress. Eyes: Conjunctivae are normal. EOMI. Head: Atraumatic. Nose: No congestion/rhinnorhea. Mouth/Throat: Mucous membranes are moist.   Neck: No stridor. Trachea Midline. FROM Cardiovascular: Normal rate, regular rhythm. Grossly normal heart sounds.  Good peripheral circulation. Respiratory: Normal respiratory effort.  No retractions. Lungs CTAB. Gastrointestinal: Soft but tender throughout.  No distention. No abdominal bruits.  Musculoskeletal: No lower extremity tenderness nor edema.  No joint effusions. Neurologic:  Normal speech and language. No gross focal neurologic deficits are appreciated.  Skin:  Skin is warm, dry and intact. No rash noted. Psychiatric: Mood and affect are normal. Speech and behavior are normal. GU: Deferred   ____________________________________________   LABS (all labs ordered are listed, but only abnormal results are displayed)  Labs Reviewed  COMPREHENSIVE METABOLIC PANEL - Abnormal; Notable for the following components:      Result Value   Sodium 123 (*)    Chloride 88 (*)    Glucose, Bld 105 (*)    Calcium 7.9 (*)    Albumin 3.0 (*)    AST 173 (*)    ALT 189 (*)    Alkaline Phosphatase 261 (*)    Total Bilirubin 10.2 (*)    All other components within normal limits  CBC - Abnormal; Notable for the following components:   WBC 16.9 (*)    All other components within normal limits  LIPASE, BLOOD  URINALYSIS, COMPLETE (UACMP) WITH MICROSCOPIC   ____________________________________________  RADIOLOGY   Official radiology report(s): CT ABDOMEN PELVIS W CONTRAST  Result Date: 02/27/2020 CLINICAL DATA:  Known pancreatitis with new abd distention^117mL OMNIPAQUE IOHEXOL 300 MG/ML SOLN EXAM: CT ABDOMEN AND PELVIS WITH CONTRAST  TECHNIQUE: Multidetector CT imaging of the abdomen and pelvis was performed using the standard protocol following bolus administration of intravenous contrast. CONTRAST:  80m OMNIPAQUE IOHEXOL 300 MG/ML  SOLN COMPARISON:  CT abdomen pelvis 02/23/2020 FINDINGS: There is motion artifact which somewhat degrades evaluation of the upper abdomen. Lower chest: Mild atelectasis at the left lung base. Hepatobiliary: No focal liver abnormality is seen. No gallstones, gallbladder wall thickening, or biliary dilatation. Pancreas: There is increased a dream a and fat stranding about the pancreatic tail. There is new mottling of the parenchyma raising the possibility of early necrosis (series 2, image 29). No focal fluid collection. Spleen: Normal in size without focal abnormality. Adrenals/Urinary Tract: Adrenal glands are unremarkable. Kidneys are normal, without renal calculi, focal lesion, or  hydronephrosis. Bladder is unremarkable. Stomach/Bowel: Stomach is within normal limits. Appendix appears normal. There is a mildly dilated fluid-filled loop of smalll bowel in the left hemiabdomen with smooth tapering, likely focal ileus. Mucosal enhancement is intact. No significant wall thickening. The remainder of the bowel is normal in appearance. Vascular/Lymphatic: No significant vascular findings are present. No enlarged abdominal or pelvic lymph nodes. Reproductive: Prostate is unremarkable. Other: Bilateral small fat containing inguinal hernias. No free air. Musculoskeletal: No acute or significant osseous findings. IMPRESSION: 1. Increased peripancreatic fat stranding and edema in the pancreatic tail with new mottling raising the possibility of early necrosis. No focal fluid collection. 2. There is a mildly dilated fluid-filled loop of small bowel in the left hemiabdomen, likely focal ileus. Electronically Signed   By: Emmaline Kluver M.D.   On: 02/27/2020 09:39     ____________________________________________   PROCEDURES  Procedure(s) performed (including Critical Care):  .Critical Care Performed by: Concha Se, MD Authorized by: Concha Se, MD   Critical care provider statement:    Critical care time (minutes):  45   Critical care was necessary to treat or prevent imminent or life-threatening deterioration of the following conditions: thrombosis requiring heparin    Critical care was time spent personally by me on the following activities:  Discussions with consultants, evaluation of patient's response to treatment, examination of patient, ordering and performing treatments and interventions, ordering and review of laboratory studies, ordering and review of radiographic studies, pulse oximetry, re-evaluation of patient's condition, obtaining history from patient or surrogate and review of old charts     ____________________________________________   INITIAL IMPRESSION / ASSESSMENT AND PLAN / ED COURSE  Lamondre G Salomon Norton was evaluated in Emergency Department on 02/27/2020 for the symptoms described in the history of present illness. He was evaluated in the context of the global COVID-19 pandemic, which necessitated consideration that the patient might be at risk for infection with the SARS-CoV-2 virus that causes COVID-19. Institutional protocols and algorithms that pertain to the evaluation of patients at risk for COVID-19 are in a state of rapid change based on information released by regulatory bodies including the CDC and federal and state organizations. These policies and algorithms were followed during the patient's care in the ED.    Patient with known alcoholic cirrhosis who comes in for worsening abdominal distention continued nausea, vomiting.  Will get labs evaluate for Electra abnormalities, AKI, worsening hepatic function, lipase to evaluate for pancreatitis.  Will get repeat CT scan given he is diffusely tender to  make sure no signs of other complications such as bowel obstruction, perforation, new ascites.  White count is downtrending from recent admission to 16.9 No evidence of anemia Sodium remains low at 123 and chloride at 88 His total bilirubin is elevated at 10.2 LFTs remain around baseline.  INR and ammonia level are reassuring.  10:26 AM D/w Dr. Allegra Lai given the bilirubin levels continue to trend up recommended getting an MRCP to rule out choledocholithiasis that would need an intervention.  If positive would have to transfer to [facility] if negative would keep patient here  MRI shows pancreatitis but also near occlusive thrombus in the portal vein and splenic vein with concerns for some decreased perfusion of the liver.  I discussed with Dr. Allegra Lai who is okay with keeping the patient here and recommends heparin and will need inpatient consult for hematology.  Discussed with patient he denies any bleeding history.  Denies any recent falls or hitting his  head.  He is okay with proceeding with blood thinner.  Patient is okay with staying here.  Will discuss possible team for admission        ____________________________________________   FINAL CLINICAL IMPRESSION(S) / ED DIAGNOSES   Final diagnoses:  Hyponatremia  Alcohol-induced acute pancreatitis, unspecified complication status  Portal vein thrombosis  Elevated bilirubin  Ileus (HCC)      MEDICATIONS GIVEN DURING THIS VISIT:  Medications  HYDROmorphone (DILAUDID) injection 0.5 mg (0.5 mg Intravenous Given 02/27/20 0838)  ondansetron (ZOFRAN) injection 4 mg (4 mg Intravenous Given 02/27/20 0837)  sodium chloride 0.9 % bolus 500 mL (0 mLs Intravenous Stopped 02/27/20 1009)  iohexol (OMNIPAQUE) 300 MG/ML solution 75 mL (100 mLs Intravenous Contrast Given 02/27/20 0857)  gadobutrol (GADAVIST) 1 MMOL/ML injection 10 mL (10 mLs Intravenous Contrast Given 02/27/20 1426)  HYDROmorphone (DILAUDID) injection 0.5 mg (0.5 mg Intravenous  Given 02/27/20 1527)     ED Discharge Orders    None       Note:  This document was prepared using Dragon voice recognition software and may include unintentional dictation errors.   Concha SeFunke, Patrici Minnis E, MD 02/27/20 [phone]

## 2020-02-27 NOTE — ED Notes (Signed)
Patient given an update on wait time. Patient verbalizes understanding.  

## 2020-02-27 NOTE — ED Notes (Signed)
Attempted to give report was on hold for 6 minutes. Will call after protected time is over.

## 2020-02-27 NOTE — ED Notes (Signed)
Pt taken to MRI at this time

## 2020-02-27 NOTE — ED Notes (Signed)
Attempted to call report at this time, was on hold for 6 minutes.

## 2020-02-27 NOTE — Progress Notes (Signed)
ANTICOAGULATION CONSULT NOTE - Initial Consult  Pharmacy Consult for Heparin Drip Indication: Portal and Splenic Vein thrombosis  No Known Allergies  Patient Measurements: Height: 5\' 6"  (167.6 cm) Weight: 112.5 kg (248 lb) IBW/kg (Calculated) : 63.8 Heparin Dosing Weight: 89.6 kg  Vital Signs: BP: 118/91 (07/18 1518) Pulse Rate: 89 (07/18 1518)  Labs: Recent Labs    02/25/20 0510 02/25/20 0510 02/26/20 0405 02/27/20 0342 02/27/20 0822  HGB 17.4*   < > 15.6 14.9  --   HCT 49.9  --  45.5 43.7  --   PLT 241  --  183 217  --   LABPROT  --   --  13.2  --  13.6  INR  --   --  1.0  --  1.1  CREATININE 0.83  --  0.82 0.72  --    < > = values in this interval not displayed.    Estimated Creatinine Clearance: 128.7 mL/min (by C-G formula based on SCr of 0.72 mg/dL).   Medical History: Past Medical History:  Diagnosis Date  . Abdominal pain   . Alcohol abuse   . Hypertension   . Psoriasis     Assessment: Patient is a 54yo male presenting with abdominal pain. Patient was found to have thrombosis of portal vein and splenic vein. Pharmacy consulted for Heparin dosing. No anticoagulants prior to admission.  Goal of Therapy:  Heparin level 0.3-0.7 units/ml Monitor platelets by anticoagulation protocol: Yes   Plan:  Give 5400 units bolus x 1 Start heparin infusion at 1500 units/hr Check anti-Xa level in 6 hours and daily while on heparin Continue to monitor H&H and platelets  54yo, PharmD, BCPS 02/27/2020 5:57 PM

## 2020-02-27 NOTE — ED Notes (Signed)
Pt transported by this RN to inpatient room via stretcher with cardiac monitoring and heparin drip infusion. Verbal SBAR given to Public relations account executive.

## 2020-02-27 NOTE — H&P (Signed)
History and Physical    Christopher Norton GUR:[id number] DOB: [birth year] DOA: 02/27/2020  Referring MD/NP/PA:   PCP: Patient, No Pcp Per   Patient coming from:  The patient is coming from home.  At baseline, pt is independent for most of ADL.        Chief Complaint: Abdominal pain  HPI: Christopher Norton is a 54 y.o. male with medical history significant of hypertension, alcohol abuse, psoriasis, alcoholic pancreatitis, alcoholic hepatitis, who presents with abdominal pain.  Patient speaks Spanish, history is obtained with iPad translator help. Patient was recently hospitalized from 7/14-7/17 due to alcoholic hepatitis and alcoholic pancreatitis.  Patient comes back due to worsening abdominal pain.  His abdominal pain is located mainly in the left side of abdomen, but also involving whole abdomen, constant, sharp, severe, nonradiating.  Patient has nausea, no vomiting or diarrhea.  Patient has mild shortness of breath, no cough or chest pain.  Denies symptoms of UTI or unilateral weakness.  Patient states that after he went home, he did not drink any alcohol.  ED Course: pt was found to have WBC 16.9, lipase of 43, INR 1.1, negative COVID-19 PCR, alcohol level less than 10, ammonia level 26, sodium 123, renal function okay, negative urinalysis, abnormal liver function (ALP 261, AST 173, ALT 189, total bilirubin 10.2, direct bilirubin 5.2), temperature normal, blood pressure 118/91, tachycardia, oxygen sat 93 to 96% on room air, RR 18. Pending MRCP formal report. Patient is admitted to MedSurg bed as inpatient. Dr. Allegra Lai of GI and Dr. Cathie Hoops of heme-onc are consulted.  CT-abd/pelvis: 1. Increased peripancreatic fat stranding and edema in the pancreatic tail with new mottling raising the possibility of early necrosis. No focal fluid collection. 2. There is a mildly dilated fluid-filled loop of small bowel in the left hemiabdomen, likely focal ileus.  Review of Systems:    General: no fevers, chills, no body weight gain, has poor appetite, has fatigue HEENT: no blurry vision, hearing changes or sore throat Respiratory: has mild dyspnea, no coughing, wheezing CV: no chest pain, no palpitations GI: has nausea, abdominal pain, no diarrhea,  vomiting,constipation GU: no dysuria, burning on urination, increased urinary frequency, hematuria  Ext: no leg edema Neuro: no unilateral weakness, numbness, or tingling, no vision change or hearing loss Skin: no skin tear. has psoriatec rash MSK: No muscle spasm, no deformity, no limitation of range of movement in spin Heme: No easy bruising.  Travel history: No recent long distant travel.  Allergy: No Known Allergies  Past Medical History:  Diagnosis Date  . Abdominal pain   . Alcohol abuse   . Hypertension   . Psoriasis     History reviewed. No pertinent surgical history.  Social History:  reports that he quit smoking about 3 years ago. His smoking use included cigarettes. He has never used smokeless tobacco. He reports current alcohol use of about 12.0 standard drinks of alcohol per week. He reports that he does not use drugs.  Family History:  Family History  Problem Relation Age of Onset  . Hypertension Father   . Hypertension Sister   . Hypertension Brother      Prior to Admission medications   Medication Sig Start Date End Date Taking? Authorizing Provider  cetirizine (ZYRTEC) 10 MG tablet Take 10 mg by mouth daily. Patient not taking: Reported on 02/27/2020    [provider]  folic acid (FOLVITE) 1 MG tablet Take 1 tablet (1 mg total) by mouth daily. 02/27/20  Arnetha Courser, MD  lisinopril (ZESTRIL) 20 MG tablet Take 1 tablet (20 mg total) by mouth daily. 02/26/20   Arnetha Courser, MD  metoprolol tartrate (LOPRESSOR) 25 MG tablet Take 0.5 tablets (12.5 mg total) by mouth 2 (two) times daily. 02/26/20   Arnetha Courser, MD  Multiple Vitamin (MULTIVITAMIN WITH MINERALS) TABS tablet Take 1  tablet by mouth daily. 02/27/20   Arnetha Courser, MD  naproxen (NAPROSYN) 500 MG tablet Take 1 tablet (500 mg total) by mouth 2 (two) times daily as needed Patient not taking: Reported on 02/27/2020    [provider]  thiamine 100 MG tablet Take 1 tablet (100 mg total) by mouth daily. 02/27/20   Arnetha Courser, MD    Physical Exam: Vitals:   02/27/20 1210 02/27/20 1215 02/27/20 1518 02/27/20 1528  BP:   (!) 118/91   Pulse: 90 93 89   Resp:    18  Temp:      TempSrc:      SpO2: 100% 94% 96%   Weight:      Height:       General: Not in acute distress HEENT:       Eyes: PERRL, EOMI, has scleral icterus.       ENT: No discharge from the ears and nose, no pharynx injection, no tonsillar enlargement.        Neck: No JVD, no bruit, no mass felt. Heme: No neck lymph node enlargement. Cardiac: S1/S2, RRR, No murmurs, No gallops or rubs. Respiratory:  No rales, wheezing, rhonchi or rubs. GI: Soft, nondistended, has tender diffusely, worse on the left side of abdomen, no rebound pain, no organomegaly, BS present. GU: No hematuria Ext: No pitting leg edema bilaterally. 2+DP/PT pulse bilaterally. Musculoskeletal: No joint deformities, No joint redness or warmth, no limitation of ROM in spin. Skin: No rashes.  Neuro: Alert, oriented X3, cranial nerves II-XII grossly intact, moves all extremities normally.  Psych: Patient is not psychotic, no suicidal or hemocidal ideation.  Labs on Admission: I have personally reviewed following labs and imaging studies  CBC: Recent Labs  Lab 02/23/20 2322 02/24/20 0618 02/25/20 0510 02/26/20 0405 02/27/20 0342  WBC 17.9* 14.7* 27.7* 20.9* 16.9*  HGB 17.8* 17.5* 17.4* 15.6 14.9  HCT 51.4 50.5 49.9 45.5 43.7  MCV 82.1 82.4 82.6 83.2 83.6  PLT 331 291 241 183 217   Basic Metabolic Panel: Recent Labs  Lab 02/22/20 2322 02/23/20 1443 02/24/20 0618 02/25/20 0510 02/26/20 0405 02/27/20 0342  NA  --  134* 129* 131* 124* 123*  K  --  3.9  3.6 3.8 3.9 3.6  CL  --  98 94* 93* 88* 88*  CO2  --  23 23 28 28 26   GLUCOSE  --  137* 133* 161* 147* 105*  BUN  --  12 13 14 17 15   CREATININE  --  0.73 0.69 0.83 0.82 0.72  CALCIUM  --  8.2* 8.1* 8.3* 8.0* 7.9*  MG 2.0  --   --   --   --   --   PHOS 3.7  --   --   --   --   --    GFR: Estimated Creatinine Clearance: 128.7 mL/min (by C-G formula based on SCr of 0.72 mg/dL). Liver Function Tests: Recent Labs  Lab 02/23/20 1443 02/24/20 0618 02/25/20 0510 02/26/20 0405 02/27/20 0342  AST 858* 488* 237* 185* 173*  ALT 557* 434* 297* 203* 189*  ALKPHOS 254* 226* 222* 201* 261*  BILITOT 2.4*  5.6* 5.2* 8.1* 10.2*  PROT 7.3 6.9 7.4 6.7 7.6  ALBUMIN 3.7 3.4* 3.3* 2.9* 3.0*   Recent Labs  Lab 02/23/20 1443 02/27/20 0342  LIPASE 63* 43   Recent Labs  Lab 02/27/20 0822  AMMONIA 26   Coagulation Profile: Recent Labs  Lab 02/23/20 1836 02/26/20 0405 02/27/20 0822  INR 1.1 1.0 1.1   Cardiac Enzymes: No results for input(s): CKTOTAL, CKMB, CKMBINDEX, TROPONINI in the last 168 hours. BNP (last 3 results) No results for input(s): PROBNP in the last 8760 hours. HbA1C: No results for input(s): HGBA1C in the last 72 hours. CBG: No results for input(s): GLUCAP in the last 168 hours. Lipid Profile: No results for input(s): CHOL, HDL, LDLCALC, TRIG, CHOLHDL, LDLDIRECT in the last 72 hours. Thyroid Function Tests: No results for input(s): TSH, T4TOTAL, FREET4, T3FREE, THYROIDAB in the last 72 hours. Anemia Panel: No results for input(s): VITAMINB12, FOLATE, FERRITIN, TIBC, IRON, RETICCTPCT in the last 72 hours. Urine analysis:    Component Value Date/Time   COLORURINE AMBER (A) 02/27/2020 0823   APPEARANCEUR CLEAR (A) 02/27/2020 0823   LABSPEC 1.017 02/27/2020 0823   PHURINE 6.0 02/27/2020 0823   GLUCOSEU NEGATIVE 02/27/2020 0823   HGBUR SMALL (A) 02/27/2020 0823   BILIRUBINUR NEGATIVE 02/27/2020 0823   KETONESUR 5 (A) 02/27/2020 0823   PROTEINUR NEGATIVE 02/27/2020  0823   NITRITE NEGATIVE 02/27/2020 0823   LEUKOCYTESUR NEGATIVE 02/27/2020 0823   Sepsis Labs: (procalcitonin:4,lacticidven:4) ) Recent Results (from the past 240 hour(s))  SARS Coronavirus 2 by RT PCR (hospital order, performed in [facility] hospital lab) Nasopharyngeal Nasopharyngeal Swab     Status: None   Collection Time: 02/23/20  8:04 PM   Specimen: Nasopharyngeal Swab  Result Value Ref Range Status   SARS Coronavirus 2 NEGATIVE NEGATIVE Final    Comment: (NOTE) SARS-CoV-2 target nucleic acids are NOT DETECTED.  The SARS-CoV-2 RNA is generally detectable in upper and lower respiratory specimens during the acute phase of infection. The lowest concentration of SARS-CoV-2 viral copies this assay can detect is 250 copies / mL. A negative result does not preclude SARS-CoV-2 infection and should not be used as the sole basis for treatment or other patient management decisions.  A negative result may occur with improper specimen collection / handling, submission of specimen other than nasopharyngeal swab, presence of viral mutation(s) within the areas targeted by this assay, and inadequate number of viral copies (<250 copies / mL). A negative result must be combined with clinical observations, patient history, and epidemiological information.  Fact Sheet for Patients:   BoilerBrush.com.cy  Fact Sheet for Healthcare Providers: https://pope.com/  This test is not yet approved or  cleared by the [place] FDA and has been authorized for detection and/or diagnosis of SARS-CoV-2 by FDA under an Emergency Use Authorization (EUA).  This EUA will remain in effect (meaning this test can be used) for the duration of the COVID-19 declaration under Section 564(b)(1) of the Act, 21 U.S.C. section 360bbb-3(b)(1), unless the authorization is terminated or revoked sooner.  Performed at [facility], [address]  [place], [place] [address]   SARS Coronavirus 2 by RT PCR (hospital order, performed in [facility] hospital lab) Nasopharyngeal Nasopharyngeal Swab     Status: None   Collection Time: 02/27/20  8:23 AM   Specimen: Nasopharyngeal Swab  Result Value Ref Range Status   SARS Coronavirus 2 NEGATIVE NEGATIVE Final    Comment: (NOTE) SARS-CoV-2 target nucleic acids are NOT DETECTED.  The  SARS-CoV-2 RNA is generally detectable in upper and lower respiratory specimens during the acute phase of infection. The lowest concentration of SARS-CoV-2 viral copies this assay can detect is 250 copies / mL. A negative result does not preclude SARS-CoV-2 infection and should not be used as the sole basis for treatment or other patient management decisions.  A negative result may occur with improper specimen collection / handling, submission of specimen other than nasopharyngeal swab, presence of viral mutation(s) within the areas targeted by this assay, and inadequate number of viral copies (<250 copies / mL). A negative result must be combined with clinical observations, patient history, and epidemiological information.  Fact Sheet for Patients:   BoilerBrush.com.cyhttps://www.fda.gov/media/136312/download  Fact Sheet for Healthcare Providers: https://pope.com/https://www.fda.gov/media/136313/download  This test is not yet approved or  cleared by the [place] FDA and has been authorized for detection and/or diagnosis of SARS-CoV-2 by FDA under an Emergency Use Authorization (EUA).  This EUA will remain in effect (meaning this test can be used) for the duration of the COVID-19 declaration under Section 564(b)(1) of the Act, 21 U.S.C. section 360bbb-3(b)(1), unless the authorization is terminated or revoked sooner.  Performed at [facility], [address] [place], [place] [address]      Radiological Exams on Admission: CT ABDOMEN PELVIS W CONTRAST  Result Date: 02/27/2020 CLINICAL DATA:  Known pancreatitis with new  abd distention^16700mL OMNIPAQUE IOHEXOL 300 MG/ML SOLN EXAM: CT ABDOMEN AND PELVIS WITH CONTRAST TECHNIQUE: Multidetector CT imaging of the abdomen and pelvis was performed using the standard protocol following bolus administration of intravenous contrast. CONTRAST:  100mL OMNIPAQUE IOHEXOL 300 MG/ML  SOLN COMPARISON:  CT abdomen pelvis 02/23/2020 FINDINGS: There is motion artifact which somewhat degrades evaluation of the upper abdomen. Lower chest: Mild atelectasis at the left lung base. Hepatobiliary: No focal liver abnormality is seen. No gallstones, gallbladder wall thickening, or biliary dilatation. Pancreas: There is increased a dream a and fat stranding about the pancreatic tail. There is new mottling of the parenchyma raising the possibility of early necrosis (series 2, image 29). No focal fluid collection. Spleen: Normal in size without focal abnormality. Adrenals/Urinary Tract: Adrenal glands are unremarkable. Kidneys are normal, without renal calculi, focal lesion, or hydronephrosis. Bladder is unremarkable. Stomach/Bowel: Stomach is within normal limits. Appendix appears normal. There is a mildly dilated fluid-filled loop of smalll bowel in the left hemiabdomen with smooth tapering, likely focal ileus. Mucosal enhancement is intact. No significant wall thickening. The remainder of the bowel is normal in appearance. Vascular/Lymphatic: No significant vascular findings are present. No enlarged abdominal or pelvic lymph nodes. Reproductive: Prostate is unremarkable. Other: Bilateral small fat containing inguinal hernias. No free air. Musculoskeletal: No acute or significant osseous findings. IMPRESSION: 1. Increased peripancreatic fat stranding and edema in the pancreatic tail with new mottling raising the possibility of early necrosis. No focal fluid collection. 2. There is a mildly dilated fluid-filled loop of small bowel in the left hemiabdomen, likely focal ileus. Electronically Signed   By: Emmaline KluverNancy   Ballantyne M.D.   On: 02/27/2020 09:39     EKG:  Not done in ED, will get one.   Assessment/Plan Principal Problem:   Abdominal pain Active Problems:   Alcoholic hepatitis   Alcoholic pancreatitis   Hyponatremia   Leukocytosis   Portal vein thrombosis   Splenic vein thrombosis   Alcohol abuse   HTN (hypertension)   Abdominal pain: Likely multifactorial etiology, including alcoholic hepatitis, alcoholic pancreatitis, portal vein thrombosis, splenic vein thrombosis.  CT scan  also showed possible focal ileus.  GI, Dr. Allegra Lai is consulted, who recommended to consult hematology due to portal vein thrombosis formation.  -will admit to MedSurg bed as inpatient -Keep patient n.p.o. tonight -IV fluid: 500 cc normal saline, followed by 100 cc/h -As needed Dilaudid and morphine for pain  Portal vein thrombosis and splenic vein thrombosis: per EDP, MRI showed near occlusive thrombus in the portal vein and splenic vein with concerns for some decreased perfusion of the liver, but I did not see formal report yet. -Started IV heparin -Message sent to oncology, Dr. Cathie Hoops for consultation  Alcoholic hepatitis -Avoid using Tylenol  Alcoholic pancreatitis: -IVF as above -pain control  Hyponatremia: Most likely poor oral intake and dehydration, potomania. - Will check urine sodium, urine osmolality, serum osmolality. - check TSH - IVF: 500 NS in ED, will continue with IV normal saline at 100 mL/h - f/u by BMP q8h - avoid over correction too fast due to risk of central pontine myelinolysis  Leukocytosis: WBC trending down from previous admission 27.7 -->20.9 -->16.9, No fever. Likely reactive. -Follow-up of a CBC  Alcohol abuse: Patient states that he did not drink alcohol after going home.  No signs of withdrawal currently. -Monitor any signs of withdrawal closely and hold CIWA protocol  HTN: -Metoprolol -IV hydralazine as needed   DVT ppx:on IV Heparin      Code Status: Full  code Family Communication: not done, no family member is at bed side.   Disposition Plan:  Anticipate discharge back to previous environment Consults called:  Dr. Allegra Lai of GI and Dr. Cathie Hoops of heme-onc Admission status: Med-surg bed as inpt       Status is: Inpatient  Remains inpatient appropriate because:Inpatient level of care appropriate due to severity of illness.  Patient has multiple comorbidities, recent admission due to alcoholic pancreatitis, alcoholic hepatitis.  Now presents with abdominal pain with new portal vein and splenic vein thrombosis formation.  He is at high risk of deteriorating.  His presentation is highly complicated.  Will need to be treated in hospital for at least 2 days   Dispo: The patient is from: Home              Anticipated d/c is to: Home              Anticipated d/c date is: 2 days              Patient currently is not medically stable to d/c.           Date of Service 02/27/2020    Lorretta Harp [facility]   If 7PM-7AM, please contact night-coverage [email or web address] 02/27/2020, 5:51 PM

## 2020-02-27 NOTE — ED Notes (Signed)
Pt returned from MRI at this time

## 2020-02-27 NOTE — ED Triage Notes (Signed)
Patient states that he was discharged from the hospital today. Patient states that he was told that his liver enzymes were elevated. Patient reports that when after he was discharged he started having swelling in his abdomen and pain.

## 2020-02-28 DIAGNOSIS — I10 Essential (primary) hypertension: Secondary | ICD-10-CM

## 2020-02-28 DIAGNOSIS — K852 Alcohol induced acute pancreatitis without necrosis or infection: Secondary | ICD-10-CM

## 2020-02-28 DIAGNOSIS — E871 Hypo-osmolality and hyponatremia: Secondary | ICD-10-CM

## 2020-02-28 DIAGNOSIS — K7010 Alcoholic hepatitis without ascites: Secondary | ICD-10-CM

## 2020-02-28 DIAGNOSIS — K759 Inflammatory liver disease, unspecified: Secondary | ICD-10-CM

## 2020-02-28 DIAGNOSIS — I81 Portal vein thrombosis: Principal | ICD-10-CM

## 2020-02-28 DIAGNOSIS — R109 Unspecified abdominal pain: Secondary | ICD-10-CM

## 2020-02-28 LAB — CBC
HCT: 40.3 % (ref 39.0–52.0)
Hemoglobin: 13.6 g/dL (ref 13.0–17.0)
MCH: 28.9 pg (ref 26.0–34.0)
MCHC: 33.7 g/dL (ref 30.0–36.0)
MCV: 85.7 fL (ref 80.0–100.0)
Platelets: 194 10*3/uL (ref 150–400)
RBC: 4.7 MIL/uL (ref 4.22–5.81)
RDW: 14.6 % (ref 11.5–15.5)
WBC: 10.7 10*3/uL — ABNORMAL HIGH (ref 4.0–10.5)
nRBC: 0 % (ref 0.0–0.2)

## 2020-02-28 LAB — COMPREHENSIVE METABOLIC PANEL
ALT: 133 U/L — ABNORMAL HIGH (ref 0–44)
AST: 112 U/L — ABNORMAL HIGH (ref 15–41)
Albumin: 2.6 g/dL — ABNORMAL LOW (ref 3.5–5.0)
Alkaline Phosphatase: 223 U/L — ABNORMAL HIGH (ref 38–126)
Anion gap: 8 (ref 5–15)
BUN: 13 mg/dL (ref 6–20)
CO2: 26 mmol/L (ref 22–32)
Calcium: 7.5 mg/dL — ABNORMAL LOW (ref 8.9–10.3)
Chloride: 98 mmol/L (ref 98–111)
Creatinine, Ser: 0.78 mg/dL (ref 0.61–1.24)
GFR calc Af Amer: >60 mL/min (ref >60)
GFR calc non Af Amer: >60 mL/min (ref >60)
Glucose, Bld: 93 mg/dL (ref 70–99)
Potassium: 3.2 mmol/L — ABNORMAL LOW (ref 3.5–5.1)
Sodium: 132 mmol/L — ABNORMAL LOW (ref 135–145)
Total Bilirubin: 4.8 mg/dL — ABNORMAL HIGH (ref 0.3–1.2)
Total Protein: 6.6 g/dL (ref 6.5–8.1)

## 2020-02-28 LAB — BASIC METABOLIC PANEL
Anion gap: 6 (ref 5–15)
BUN: 11 mg/dL (ref 6–20)
CO2: 26 mmol/L (ref 22–32)
Calcium: 7.7 mg/dL — ABNORMAL LOW (ref 8.9–10.3)
Chloride: 98 mmol/L (ref 98–111)
Creatinine, Ser: 0.6 mg/dL — ABNORMAL LOW (ref 0.61–1.24)
GFR calc Af Amer: >60 mL/min (ref >60)
GFR calc non Af Amer: >60 mL/min (ref >60)
Glucose, Bld: 148 mg/dL — ABNORMAL HIGH (ref 70–99)
Potassium: 3.5 mmol/L (ref 3.5–5.1)
Sodium: 130 mmol/L — ABNORMAL LOW (ref 135–145)

## 2020-02-28 LAB — GLUCOSE, CAPILLARY
Glucose-Capillary: 92 mg/dL (ref 70–99)
Glucose-Capillary: 118 mg/dL — ABNORMAL HIGH (ref 70–99)
Glucose-Capillary: 90 mg/dL (ref 70–99)

## 2020-02-28 LAB — HEPARIN LEVEL (UNFRACTIONATED)
Heparin Unfractionated: 0.16 IU/mL — ABNORMAL LOW (ref 0.30–0.70)
Heparin Unfractionated: 0.1 IU/mL — ABNORMAL LOW (ref 0.30–0.70)
Heparin Unfractionated: <0.1 IU/mL — ABNORMAL LOW (ref 0.30–0.70)

## 2020-02-28 LAB — SODIUM, URINE, RANDOM: Sodium, Ur: 52 mmol/L

## 2020-02-28 LAB — TSH: TSH: 4.05 u[IU]/mL (ref 0.350–4.500)

## 2020-02-28 MED ORDER — POTASSIUM CHLORIDE CRYS ER 20 MEQ PO TBCR
40.0000 meq | EXTENDED_RELEASE_TABLET | Freq: Once | ORAL | Status: AC
  Administered 2020-02-28: 40 meq via ORAL
  Filled 2020-02-28: qty 2

## 2020-02-28 MED ORDER — HEPARIN BOLUS VIA INFUSION
1200.0000 [IU] | Freq: Once | INTRAVENOUS | Status: AC
  Administered 2020-02-28: 1200 [IU] via INTRAVENOUS
  Filled 2020-02-28: qty 1200

## 2020-02-28 MED ORDER — HEPARIN BOLUS VIA INFUSION
2700.0000 [IU] | Freq: Once | INTRAVENOUS | Status: AC
  Administered 2020-02-28: 2700 [IU] via INTRAVENOUS
  Filled 2020-02-28: qty 2700

## 2020-02-28 MED ORDER — HEPARIN BOLUS VIA INFUSION
2500.0000 [IU] | Freq: Once | INTRAVENOUS | Status: AC
  Administered 2020-02-28: 2500 [IU] via INTRAVENOUS
  Filled 2020-02-28: qty 2500
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
## 2020-02-28 NOTE — Progress Notes (Signed)
ANTICOAGULATION CONSULT NOTE - Initial Consult  Pharmacy Consult for Heparin Drip Indication: Portal and Splenic Vein thrombosis  No Known Allergies  Patient Measurements: Height: 5\' 6"  (167.6 cm) Weight: 112.5 kg (248 lb) IBW/kg (Calculated) : 63.8 Heparin Dosing Weight: 89.6 kg  Vital Signs: Temp: 97.9 F (36.6 C) (07/19 0014) Temp Source: Oral (07/19 0014) BP: 143/91 (07/19 0014) Pulse Rate: 85 (07/19 0014)  Labs: Recent Labs    02/25/20 0510 02/25/20 0510 02/26/20 0405 02/27/20 0342 02/27/20 0822 02/27/20 2032 02/28/20 0122  HGB 17.4*   < > 15.6 14.9  --   --   --   HCT 49.9  --  45.5 43.7  --   --   --   PLT 241  --  183 217  --   --   --   LABPROT  --   --  13.2  --  13.6  --   --   INR  --   --  1.0  --  1.1  --   --   HEPARINUNFRC  --   --   --   --   --   --  0.16*  CREATININE 0.83   < > 0.82 0.72  --  0.75  --    < > = values in this interval not displayed.    Estimated Creatinine Clearance: 128.7 mL/min (by C-G formula based on SCr of 0.75 mg/dL).   Medical History: Past Medical History:  Diagnosis Date  . Abdominal pain   . Alcohol abuse   . Hypertension   . Psoriasis     Assessment: Patient is a 54yo male presenting with abdominal pain. Patient was found to have thrombosis of portal vein and splenic vein. Pharmacy consulted for Heparin dosing. No anticoagulants prior to admission.  Goal of Therapy:  Heparin level 0.3-0.7 units/ml Monitor platelets by anticoagulation protocol: Yes   Plan:  07/19 @ 0300 HL 0.16 subtherapeutic. Will rebolus w/ heparin 1200 units IV x 1 and increase rate to 1650 units/hr and will recheck HL at 0900, CBC stable will continue to monitor.  8/19, PharmD, BCPS Clinical Pharmacist 02/28/2020 3:31 AM

## 2020-02-28 NOTE — Progress Notes (Signed)
ANTICOAGULATION CONSULT NOTE  Pharmacy Consult for Heparin Drip Indication: Portal and Splenic Vein thrombosis  No Known Allergies  Patient Measurements: Height: 5\' 6"  (167.6 cm) Weight: 112.5 kg (248 lb) IBW/kg (Calculated) : 63.8 Heparin Dosing Weight: 89.6 kg  Vital Signs: Temp: 98.2 F (36.8 C) (07/19 0433) Temp Source: Oral (07/19 0433) BP: 128/86 (07/19 0433) Pulse Rate: 86 (07/19 0433)  Labs: Recent Labs    02/26/20 0405 02/26/20 0405 02/27/20 0342 02/27/20 0822 02/27/20 2032 02/28/20 0122 02/28/20 0406  HGB 15.6   < > 14.9  --   --   --  13.6  HCT 45.5  --  43.7  --   --   --  40.3  PLT 183  --  217  --   --   --  194  LABPROT 13.2  --   --  13.6  --   --   --   INR 1.0  --   --  1.1  --   --   --   HEPARINUNFRC  --   --   --   --   --  0.16*  --   CREATININE 0.82   < > 0.72  --  0.75  --  0.78   < > = values in this interval not displayed.    Estimated Creatinine Clearance: 128.7 mL/min (by C-G formula based on SCr of 0.78 mg/dL).   Medical History: Past Medical History:  Diagnosis Date   Abdominal pain    Alcohol abuse    Hypertension    Psoriasis     Assessment: Patient is a 54yo male presenting with abdominal pain. Patient was found to have thrombosis of portal vein and splenic vein. Pharmacy consulted for Heparin dosing. No anticoagulants prior to admission. H&H, platelets trending down slightly but still WNL  Heparin Course: 07/18 initiation: 1500 units/hr 07/19 0122 HL 0.16: inc to 1650 units/hr 07/19 1028 HL 0.10: inc to 2000 units/hr  Goal of Therapy:  Heparin level 0.3-0.7 units/ml Monitor platelets by anticoagulation protocol: Yes   Plan:   Anti-Xa level subtherapeutic and trending down in spite of infusion rate increase:   rebolus w/ heparin 2500 units IV x 1 and increase rate to 2000 units/hr  recheck anti-Xa level 6 hours after rate change  CBC in am 7/20  8/20, PharmD, BCPS Clinical  Pharmacist 02/28/2020 6:58 AM

## 2020-02-28 NOTE — Progress Notes (Signed)
ANTICOAGULATION CONSULT NOTE  Pharmacy Consult for Heparin Drip Indication: Portal and Splenic Vein thrombosis  No Known Allergies  Patient Measurements: Height: 5\' 6"  (167.6 cm) Weight: 112.5 kg (248 lb) IBW/kg (Calculated) : 63.8 Heparin Dosing Weight: 89.6 kg  Vital Signs: Temp: 98 F (36.7 C) (07/19 2012) Temp Source: Oral (07/19 2012) BP: 154/99 (07/19 2012) Pulse Rate: 84 (07/19 2012)  Labs: Recent Labs    02/26/20 0405 02/26/20 0405 02/27/20 0342 02/27/20 0342 02/27/20 0822 02/27/20 2032 02/28/20 0122 02/28/20 0406 02/28/20 1028 02/28/20 1955  HGB 15.6   < > 14.9  --   --   --   --  13.6  --   --   HCT 45.5  --  43.7  --   --   --   --  40.3  --   --   PLT 183  --  217  --   --   --   --  194  --   --   LABPROT 13.2  --   --   --  13.6  --   --   --   --   --   INR 1.0  --   --   --  1.1  --   --   --   --   --   HEPARINUNFRC  --   --   --   --   --   --  0.16*  --  0.10* <0.10*  CREATININE 0.82   < > 0.72   < >  --  0.75  --  0.78 0.60*  --    < > = values in this interval not displayed.    Estimated Creatinine Clearance: 128.7 mL/min (A) (by C-G formula based on SCr of 0.6 mg/dL (L)).   Medical History: Past Medical History:  Diagnosis Date  . Abdominal pain   . Alcohol abuse   . Hypertension   . Psoriasis     Assessment: Patient is a 54yo male presenting with abdominal pain. Patient was found to have thrombosis of portal vein and splenic vein. Pharmacy consulted for Heparin dosing. No anticoagulants prior to admission. H&H, platelets trending down slightly but still WNL  Heparin Course: 07/18 initiation: 1500 units/hr 07/19 0122 HL 0.16: inc to 1650 units/hr 07/19 1028 HL 0.10: inc to 2000 units/hr 07/19 1955 HL < 0.1  Goal of Therapy:  Heparin level 0.3-0.7 units/ml Monitor platelets by anticoagulation protocol: Yes   Plan:  Will order Heparin 2700 units IV X 1 bolus and increase drip rate to 2350 units/hr.  Will recheck HL 6 hrs after  rate change.   Makyia Erxleben D Clinical Pharmacist 02/28/2020 10:07 PM
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
## 2020-02-29 LAB — HEPARIN LEVEL (UNFRACTIONATED): Heparin Unfractionated: 0.65 IU/mL (ref 0.30–0.70)

## 2020-02-29 LAB — CBC
HCT: 42.8 % (ref 39.0–52.0)
Hemoglobin: 14 g/dL (ref 13.0–17.0)
MCH: 28.8 pg (ref 26.0–34.0)
MCHC: 32.7 g/dL (ref 30.0–36.0)
MCV: 88.1 fL (ref 80.0–100.0)
Platelets: 217 10*3/uL (ref 150–400)
RBC: 4.86 MIL/uL (ref 4.22–5.81)
RDW: 15.1 % (ref 11.5–15.5)
WBC: 11.4 10*3/uL — ABNORMAL HIGH (ref 4.0–10.5)
nRBC: 0 % (ref 0.0–0.2)

## 2020-02-29 LAB — THYROID PANEL WITH TSH
Free Thyroxine Index: 1.8 (ref 1.2–4.9)
T3 Uptake Ratio: 35 % (ref 24–39)
T4, Total: 5 ug/dL (ref 4.5–12.0)
TSH: 1.45 u[IU]/mL (ref 0.450–4.500)

## 2020-02-29 LAB — GLUCOSE, CAPILLARY: Glucose-Capillary: 105 mg/dL — ABNORMAL HIGH (ref 70–99)

## 2020-02-29 MED ORDER — APIXABAN 5 MG PO TABS: 10.0000 mg | ORAL_TABLET | Freq: Two times a day (BID) | ORAL | 0 refills | Status: DC

## 2020-02-29 MED ORDER — APIXABAN 5 MG PO TABS: 5.0000 mg | ORAL_TABLET | Freq: Two times a day (BID) | ORAL | Status: DC

## 2020-02-29 MED ORDER — APIXABAN 5 MG PO TABS
10.0000 mg | ORAL_TABLET | Freq: Two times a day (BID) | ORAL | Status: DC
  Administered 2020-02-29: 10 mg via ORAL
  Filled 2020-02-29: qty 2

## 2020-02-29 MED ORDER — FOLIC ACID 1 MG PO TABS: 1.0000 mg | ORAL_TABLET | Freq: Every day | ORAL | 0 refills | Status: AC

## 2020-02-29 MED ORDER — APIXABAN 5 MG PO TABS: 5.0000 mg | ORAL_TABLET | Freq: Two times a day (BID) | ORAL | 3 refills | Status: AC

## 2020-02-29 NOTE — TOC Transition Note (Signed)
Transition of Care [facility]) - CM/SW Discharge Note   Patient Details  Name: Christopher Norton MRN: [id number] Date of Birth: 24-Feb-1966  Transition of Care [facility]) CM/SW Contact:  Candie Chroman, LCSW Phone Number: 02/29/2020, 12:10 PM   Clinical Narrative: CSW met with patient. No supports at bedside. CSW introduced role and explained that discharge planning would be discussed. He declined interpreter. Patient has no insurance/PCP. CSW gave patient booklet for free/lost-cost healthcare in [facility] with [facility] intake paperwork in case he decides to follow up with them. Patient does not want to fill Eliquis at [facility]. He prefers to fill it at [facility] in [place]. Gave 30-day free Eliquis card. CSW encouraged him to follow up with [facility] so he can get his follow up medications for free/low-cost. No further concerns. Patient stated he does not plan on drinking anymore. Patient has orders to discharge home today. He said he will drive himself home. CSW signing off.    Final next level of care: Home/Self Care Barriers to Discharge: No Barriers Identified   Patient Goals and CMS Choice        Discharge Placement                       Discharge Plan and Services     Post Acute Care Choice: NA                               Social Determinants of Health (SDOH) Interventions     Readmission Risk Interventions No flowsheet data found.

## 2020-02-29 NOTE — Progress Notes (Addendum)
ANTICOAGULATION CONSULT NOTE  Pharmacy Consult for Heparin Drip Indication: Portal and Splenic Vein thrombosis  No Known Allergies  Patient Measurements: Height: 5\' 6"  (167.6 cm) Weight: 112.5 kg (248 lb) IBW/kg (Calculated) : 63.8 Heparin Dosing Weight: 89.6 kg  Vital Signs: Temp: 97.8 F (36.6 C) (07/20 0620) Temp Source: Oral (07/20 0620) BP: 134/101 (07/20 0620) Pulse Rate: 75 (07/20 0620)  Labs: Recent Labs    02/27/20 0342 02/27/20 0342 02/27/20 0822 02/27/20 2032 02/28/20 0122 02/28/20 0406 02/28/20 1028 02/28/20 1955 02/29/20 0434  HGB 14.9   < >  --   --   --  13.6  --   --  14.0  HCT 43.7  --   --   --   --  40.3  --   --  42.8  PLT 217  --   --   --   --  194  --   --  217  LABPROT  --   --  13.6  --   --   --   --   --   --   INR  --   --  1.1  --   --   --   --   --   --   HEPARINUNFRC  --   --   --   --    < >  --  0.10* <0.10* 0.65  CREATININE 0.72   < >  --  0.75  --  0.78 0.60*  --   --    < > = values in this interval not displayed.    Estimated Creatinine Clearance: 128.7 mL/min (A) (by C-G formula based on SCr of 0.6 mg/dL (L)).   Medical History: Past Medical History:  Diagnosis Date  . Abdominal pain   . Alcohol abuse   . Hypertension   . Psoriasis     Assessment: Patient is a 54yo male presenting with abdominal pain. Patient was found to have thrombosis of portal vein and splenic vein. Pharmacy consulted for Heparin dosing. No anticoagulants prior to admission. H&H, platelets trending down slightly but still WNL  Heparin Course: 07/18 initiation: 1500 units/hr 07/19 0122 HL 0.16: inc to 1650 units/hr 07/19 1028 HL 0.10: inc to 2000 units/hr 07/20 0434 HL 0.65: therapeutic x 1, continue current rate, CBC stable  Goal of Therapy:  Heparin level 0.3-0.7 units/ml Monitor platelets by anticoagulation protocol: Yes   Plan:   Anti-Xa level therapeutic x 1.    continue rate at 2000 units/hr  recheck anti-Xa level 6 hours to  confirm  CBC in am 7/21  8/21, PharmD Clinical Pharmacist 02/29/2020 6:22 AM

## 2020-02-29 NOTE — Plan of Care (Signed)
Discharge order received. Patient mental status is at baseline. Vital signs stable . No signs of acute distress. Discharge instructions given especially the med elequis. Patient verbalized understanding. No other issues noted at this time.

## 2020-02-29 NOTE — Progress Notes (Signed)
ANTICOAGULATION CONSULT NOTE  Pharmacy Consult for apixaban Indication: Portal and Splenic Vein thrombosis  No Known Allergies  Patient Measurements: Height: 5\' 6"  (167.6 cm) Weight: 112.5 kg (248 lb) IBW/kg (Calculated) : 63.8 Heparin Dosing Weight: 89.6 kg  Vital Signs: Temp: 97.8 F (36.6 C) (07/20 0620) Temp Source: Oral (07/20 0620) BP: 143/98 (07/20 0645) Pulse Rate: 82 (07/20 0645)  Labs: Recent Labs    02/27/20 0342 02/27/20 0342 02/27/20 0822 02/27/20 2032 02/28/20 0122 02/28/20 0406 02/28/20 1028 02/28/20 1955 02/29/20 0434  HGB 14.9   < >  --   --   --  13.6  --   --  14.0  HCT 43.7  --   --   --   --  40.3  --   --  42.8  PLT 217  --   --   --   --  194  --   --  217  LABPROT  --   --  13.6  --   --   --   --   --   --   INR  --   --  1.1  --   --   --   --   --   --   HEPARINUNFRC  --   --   --   --    < >  --  0.10* <0.10* 0.65  CREATININE 0.72   < >  --  0.75  --  0.78 0.60*  --   --    < > = values in this interval not displayed.    Estimated Creatinine Clearance: 128.7 mL/min (A) (by C-G formula based on SCr of 0.6 mg/dL (L)).   Medical History: Past Medical History:  Diagnosis Date  . Abdominal pain   . Alcohol abuse   . Hypertension   . Psoriasis     Assessment: Patient is a 54yo male presenting with abdominal pain. Patient was found to have thrombosis of portal vein and splenic vein. Pharmacy consulted for Heparin dosing. No anticoagulants prior to admission. H&H, platelets trending down slightly but still WNL  Goal of Therapy:  Heparin level 0.3-0.7 units/ml Monitor platelets by anticoagulation protocol: Yes   Plan:   Stop heparin then one hour later start apixaban 10 mg twice daily for 7 days followed by 5 mg twice daily  CBC at least every 3 days per protocol  54yo, PharmD, BCPS Clinical Pharmacist 02/29/2020 7:12 AM

## 2020-03-01 ENCOUNTER — Telehealth: Payer: Self-pay

## 2020-03-01 ENCOUNTER — Telehealth: Payer: Self-pay | Admitting: Oncology

## 2020-03-01 DIAGNOSIS — I81 Portal vein thrombosis: Secondary | ICD-10-CM

## 2020-03-01 DIAGNOSIS — I82890 Acute embolism and thrombosis of other specified veins: Secondary | ICD-10-CM

## 2020-03-01 NOTE — Telephone Encounter (Signed)
Appt has been scheduled for 03/14/20--2:15 for lab and 2:45 MD (lab orders entered).

## 2020-03-01 NOTE — Telephone Encounter (Signed)
03/01/2020 Called but could not reach pt to inform him of his appt with MD following his discharge from the hospital. Left voicemail confirming that the appt is scheduled for 03/17/20 at 2:15 and to call back if he needed to r/s or had any questions  SRW

## 2020-03-01 NOTE — Telephone Encounter (Signed)
-----   Message from Guerry Minors, [place] sent at 03/01/2020  9:05 AM EDT -----  ----- Message ----- From: Guerry Minors, CMA Sent: 03/01/2020   8:33 AM EDT To:     ----- Message ----- From: Rickard Patience, MD Sent: 03/01/2020   8:02 AM EDT To: Coralee Rud, RN, Guerry Minors, CMA  He was seen by me during his admission.  Please schedule him to see me in 2-3 weeks, lab cbc cmp thanks.

## 2020-03-04 NOTE — Discharge Summary (Signed)
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      Time coordinating discharge: 32 minutes.  SIGNED:   Kathlen Mody, MD  [facility] 03/04/2020, 12:21 AM

## 2020-03-17 ENCOUNTER — Inpatient Hospital Stay: Payer: Self-pay

## 2020-03-17 ENCOUNTER — Inpatient Hospital Stay: Payer: Self-pay | Attending: Oncology | Admitting: Oncology

## 2020-03-17 ENCOUNTER — Encounter: Payer: Self-pay | Admitting: Oncology

## 2020-03-17 ENCOUNTER — Inpatient Hospital Stay: Payer: Self-pay | Admitting: Oncology

## 2020-03-17 ENCOUNTER — Other Ambulatory Visit: Payer: Self-pay

## 2020-03-17 VITALS — BP 127/84 | HR 77 | Temp 96.4°F | Resp 18 | Ht 66.0 in | Wt 247.5 lb

## 2020-03-17 DIAGNOSIS — K760 Fatty (change of) liver, not elsewhere classified: Secondary | ICD-10-CM | POA: Insufficient documentation

## 2020-03-17 DIAGNOSIS — Z79899 Other long term (current) drug therapy: Secondary | ICD-10-CM | POA: Insufficient documentation

## 2020-03-17 DIAGNOSIS — I10 Essential (primary) hypertension: Secondary | ICD-10-CM | POA: Insufficient documentation

## 2020-03-17 DIAGNOSIS — Z7901 Long term (current) use of anticoagulants: Secondary | ICD-10-CM | POA: Insufficient documentation

## 2020-03-17 DIAGNOSIS — I82890 Acute embolism and thrombosis of other specified veins: Secondary | ICD-10-CM | POA: Insufficient documentation

## 2020-03-17 DIAGNOSIS — K4020 Bilateral inguinal hernia, without obstruction or gangrene, not specified as recurrent: Secondary | ICD-10-CM | POA: Insufficient documentation

## 2020-03-17 DIAGNOSIS — I81 Portal vein thrombosis: Secondary | ICD-10-CM

## 2020-03-17 DIAGNOSIS — D6851 Activated protein C resistance: Secondary | ICD-10-CM | POA: Insufficient documentation

## 2020-03-17 DIAGNOSIS — Z87891 Personal history of nicotine dependence: Secondary | ICD-10-CM | POA: Insufficient documentation

## 2020-03-17 DIAGNOSIS — R531 Weakness: Secondary | ICD-10-CM | POA: Insufficient documentation

## 2020-03-17 DIAGNOSIS — F1010 Alcohol abuse, uncomplicated: Secondary | ICD-10-CM | POA: Insufficient documentation

## 2020-03-17 LAB — CBC WITH DIFFERENTIAL/PLATELET
Abs Immature Granulocytes: 0.05 10*3/uL (ref 0.00–0.07)
Basophils Absolute: 0 10*3/uL (ref 0.0–0.1)
Basophils Relative: 1 %
Eosinophils Absolute: 0.3 10*3/uL (ref 0.0–0.5)
Eosinophils Relative: 3 %
HCT: 41.6 % (ref 39.0–52.0)
Hemoglobin: 14.1 g/dL (ref 13.0–17.0)
Immature Granulocytes: 1 %
Lymphocytes Relative: 21 %
Lymphs Abs: 1.9 10*3/uL (ref 0.7–4.0)
MCH: 29 pg (ref 26.0–34.0)
MCHC: 33.9 g/dL (ref 30.0–36.0)
MCV: 85.6 fL (ref 80.0–100.0)
Monocytes Absolute: 0.5 10*3/uL (ref 0.1–1.0)
Monocytes Relative: 6 %
Neutro Abs: 6.1 10*3/uL (ref 1.7–7.7)
Neutrophils Relative %: 68 %
Platelets: 419 10*3/uL — ABNORMAL HIGH (ref 150–400)
RBC: 4.86 MIL/uL (ref 4.22–5.81)
RDW: 12.7 % (ref 11.5–15.5)
WBC: 8.9 10*3/uL (ref 4.0–10.5)
nRBC: 0 % (ref 0.0–0.2)

## 2020-03-17 LAB — COMPREHENSIVE METABOLIC PANEL
ALT: 79 U/L — ABNORMAL HIGH (ref 0–44)
AST: 57 U/L — ABNORMAL HIGH (ref 15–41)
Albumin: 3.9 g/dL (ref 3.5–5.0)
Alkaline Phosphatase: 124 U/L (ref 38–126)
Anion gap: 9 (ref 5–15)
BUN: 13 mg/dL (ref 6–20)
CO2: 26 mmol/L (ref 22–32)
Calcium: 8.6 mg/dL — ABNORMAL LOW (ref 8.9–10.3)
Chloride: 104 mmol/L (ref 98–111)
Creatinine, Ser: 0.87 mg/dL (ref 0.61–1.24)
GFR calc Af Amer: >60 mL/min (ref >60)
GFR calc non Af Amer: >60 mL/min (ref >60)
Glucose, Bld: 132 mg/dL — ABNORMAL HIGH (ref 70–99)
Potassium: 4.3 mmol/L (ref 3.5–5.1)
Sodium: 139 mmol/L (ref 135–145)
Total Bilirubin: 1.1 mg/dL (ref 0.3–1.2)
Total Protein: 7.4 g/dL (ref 6.5–8.1)

## 2020-03-17 NOTE — Progress Notes (Signed)
Pt here for hospital follow up. Pt reports he has problems sleeping. Had an episode this past week where he felt shortness of breath, weakness and had blurry vision & tremors. Pt has strong paternal family history of heart attack. He does not have a PCP. Provided him with a list of PCP to establish care.
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
## 2020-06-12 ENCOUNTER — Inpatient Hospital Stay: Payer: Self-pay

## 2020-06-12 ENCOUNTER — Telehealth: Payer: Self-pay | Admitting: *Deleted

## 2020-06-12 ENCOUNTER — Ambulatory Visit: Payer: Self-pay

## 2020-06-12 NOTE — Telephone Encounter (Signed)
[name] department called reporting that patient did NOT show up for his [name]

## 2020-06-12 NOTE — Telephone Encounter (Signed)
Patient also did not come for lab appointment today.  He is scheduled to see MD on 11/15 which was for 2 weeks after labs.  Please call patient to have the US/lab/MD 2 weeks later r/s.

## 2020-06-12 NOTE — Telephone Encounter (Signed)
Done.. Lanora Manis can you please contact pt and make him aware (Interpreter Requested) All appts has been R/S as requested. [name] at [facility] R/S to 11/5 @ 930 must arrive by 9:00am NPO after midnight  Labs on 11/5 @ 10:30 MD on 06/30/20 @ 10:45

## 2020-06-13 NOTE — Telephone Encounter (Signed)
Done.. Appts has been R/S per pt request. Christopher Norton can you please call pt to make him aware of his scheduled appts.

## 2020-06-13 NOTE — Telephone Encounter (Addendum)
Spoke to patient and he states that [place] so he will not be able to do the [name] on 11/5. Would like [name] rescheduled to the week of 11/15 or after. Please reschedule appts and I will give pt call with appts.

## 2020-06-15 NOTE — Telephone Encounter (Signed)
Called pt multiple times and unable to reach him and unable to leave message. Appt reminders mailed.

## 2020-06-16 ENCOUNTER — Inpatient Hospital Stay: Payer: Self-pay

## 2020-06-16 ENCOUNTER — Other Ambulatory Visit: Payer: Self-pay

## 2020-06-19 ENCOUNTER — Other Ambulatory Visit: Payer: Self-pay

## 2020-06-26 ENCOUNTER — Other Ambulatory Visit: Payer: Self-pay

## 2020-06-26 ENCOUNTER — Inpatient Hospital Stay: Payer: Self-pay | Attending: Oncology

## 2020-06-26 ENCOUNTER — Ambulatory Visit: Payer: Self-pay | Admitting: Oncology

## 2020-06-26 DIAGNOSIS — I81 Portal vein thrombosis: Secondary | ICD-10-CM

## 2020-06-26 DIAGNOSIS — I10 Essential (primary) hypertension: Secondary | ICD-10-CM | POA: Insufficient documentation

## 2020-06-26 DIAGNOSIS — F1010 Alcohol abuse, uncomplicated: Secondary | ICD-10-CM | POA: Insufficient documentation

## 2020-06-26 DIAGNOSIS — Z87891 Personal history of nicotine dependence: Secondary | ICD-10-CM | POA: Insufficient documentation

## 2020-06-26 DIAGNOSIS — I82890 Acute embolism and thrombosis of other specified veins: Secondary | ICD-10-CM | POA: Insufficient documentation

## 2020-06-26 DIAGNOSIS — Z79899 Other long term (current) drug therapy: Secondary | ICD-10-CM | POA: Insufficient documentation

## 2020-06-26 LAB — COMPREHENSIVE METABOLIC PANEL
ALT: 32 U/L (ref 0–44)
AST: 45 U/L — ABNORMAL HIGH (ref 15–41)
Albumin: 4 g/dL (ref 3.5–5.0)
Alkaline Phosphatase: 102 U/L (ref 38–126)
Anion gap: 11 (ref 5–15)
BUN: 17 mg/dL (ref 6–20)
CO2: 24 mmol/L (ref 22–32)
Calcium: 9.2 mg/dL (ref 8.9–10.3)
Chloride: 103 mmol/L (ref 98–111)
Creatinine, Ser: 0.93 mg/dL (ref 0.61–1.24)
GFR, Estimated: >60 mL/min (ref >60)
Glucose, Bld: 103 mg/dL — ABNORMAL HIGH (ref 70–99)
Potassium: 3.6 mmol/L (ref 3.5–5.1)
Sodium: 138 mmol/L (ref 135–145)
Total Bilirubin: 0.6 mg/dL (ref 0.3–1.2)
Total Protein: 8.1 g/dL (ref 6.5–8.1)

## 2020-06-26 LAB — CBC WITH DIFFERENTIAL/PLATELET
Abs Immature Granulocytes: 0.08 10*3/uL — ABNORMAL HIGH (ref 0.00–0.07)
Basophils Absolute: 0.1 10*3/uL (ref 0.0–0.1)
Basophils Relative: 1 %
Eosinophils Absolute: 0.2 10*3/uL (ref 0.0–0.5)
Eosinophils Relative: 2 %
HCT: 46.7 % (ref 39.0–52.0)
Hemoglobin: 16.1 g/dL (ref 13.0–17.0)
Immature Granulocytes: 1 %
Lymphocytes Relative: 23 %
Lymphs Abs: 2.3 10*3/uL (ref 0.7–4.0)
MCH: 28 pg (ref 26.0–34.0)
MCHC: 34.5 g/dL (ref 30.0–36.0)
MCV: 81.4 fL (ref 80.0–100.0)
Monocytes Absolute: 0.5 10*3/uL (ref 0.1–1.0)
Monocytes Relative: 5 %
Neutro Abs: 6.8 10*3/uL (ref 1.7–7.7)
Neutrophils Relative %: 68 %
Platelets: 337 10*3/uL (ref 150–400)
RBC: 5.74 MIL/uL (ref 4.22–5.81)
RDW: 13 % (ref 11.5–15.5)
WBC: 10 10*3/uL (ref 4.0–10.5)
nRBC: 0 % (ref 0.0–0.2)

## 2020-06-27 ENCOUNTER — Telehealth: Payer: Self-pay | Admitting: *Deleted

## 2020-06-27 ENCOUNTER — Ambulatory Visit: Payer: Self-pay

## 2020-06-27 NOTE — Telephone Encounter (Signed)
[name] department called to report that patient was a No Show for his [name] this morning

## 2020-06-27 NOTE — Telephone Encounter (Signed)
Spoke to patient and he reports that he did not know of appt. I told him that I attempted to call him multiple times and even mailed appt reminders, but he said he did not check his mail. Pt will be in town this week and wants to know if [name] can be rescheduled to this week.?

## 2020-06-27 NOTE — Telephone Encounter (Signed)
Pt rescheduled for [name] on 11/19 at 8:30am and MD on 11/19@1 :15. Called and unable to reach him but left a detailed message. Will call again to make sure he knows about appts.   Pt reported that hes has not taken Eliquis for about 2 months because of cost. Spoke to [place] and she said that they have some samples here if he wants to combe by and pick some up. He can also apply for patient assistance and come by to sign application. He will need to bring proof of income like tax return, W-2 or 2 pay stubs. Left his on his VM, but will reach out to him again.

## 2020-06-28 NOTE — Telephone Encounter (Signed)
Called and spoke to pt. He is aware of [name] and Dr. Cathie Hoops appt on 11/19. He is unable to come sooner to sign for pt assistance, but will at MD appt.

## 2020-06-29 LAB — FACTOR 5 LEIDEN

## 2020-06-30 ENCOUNTER — Inpatient Hospital Stay (HOSPITAL_BASED_OUTPATIENT_CLINIC_OR_DEPARTMENT_OTHER): Payer: Self-pay | Admitting: Oncology

## 2020-06-30 ENCOUNTER — Ambulatory Visit: Payer: Self-pay | Admitting: Oncology

## 2020-06-30 ENCOUNTER — Other Ambulatory Visit: Payer: Self-pay

## 2020-06-30 ENCOUNTER — Encounter: Payer: Self-pay | Admitting: Oncology

## 2020-06-30 ENCOUNTER — Ambulatory Visit
Admission: RE | Admit: 2020-06-30 | Discharge: 2020-06-30 | Disposition: A | Discharge status: Home / Self Care | Payer: Self-pay | Source: Ambulatory Visit | Attending: Oncology | Admitting: Oncology

## 2020-06-30 VITALS — BP 150/87 | HR 89 | Temp 98.0°F | Resp 18 | Wt 224.4 lb

## 2020-06-30 DIAGNOSIS — I82890 Acute embolism and thrombosis of other specified veins: Secondary | ICD-10-CM | POA: Insufficient documentation

## 2020-06-30 DIAGNOSIS — I81 Portal vein thrombosis: Secondary | ICD-10-CM

## 2020-06-30 LAB — PROTHROMBIN GENE MUTATION
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
## 2020-06-30 NOTE — Progress Notes (Signed)
Patient here for follow up. Pt reports bruises to left arm, tender to touch. Does not remember hitting arm anywhere. Pt reports that he has not taken eliquis for approx 7 weeks due to cost issue and having no insurance.

## 2020-07-12 ENCOUNTER — Ambulatory Visit: Payer: Self-pay | Admitting: Oncology

## 2020-07-13 LAB — CALR + JAK2 E12-15 + MPL (REFLEXED)

## 2020-07-13 LAB — JAK2 V617F, W REFLEX TO CALR/E12/MPL

## 2020-10-01 IMAGING — DX DG CHEST 1V PORT
2 series · 2 of 2 positions shown · non-contrast
Comparison: None.

CLINICAL DATA: Fever, headaches.

EXAM:
PORTABLE CHEST 1 VIEW

[chest ap (1 of 2)]
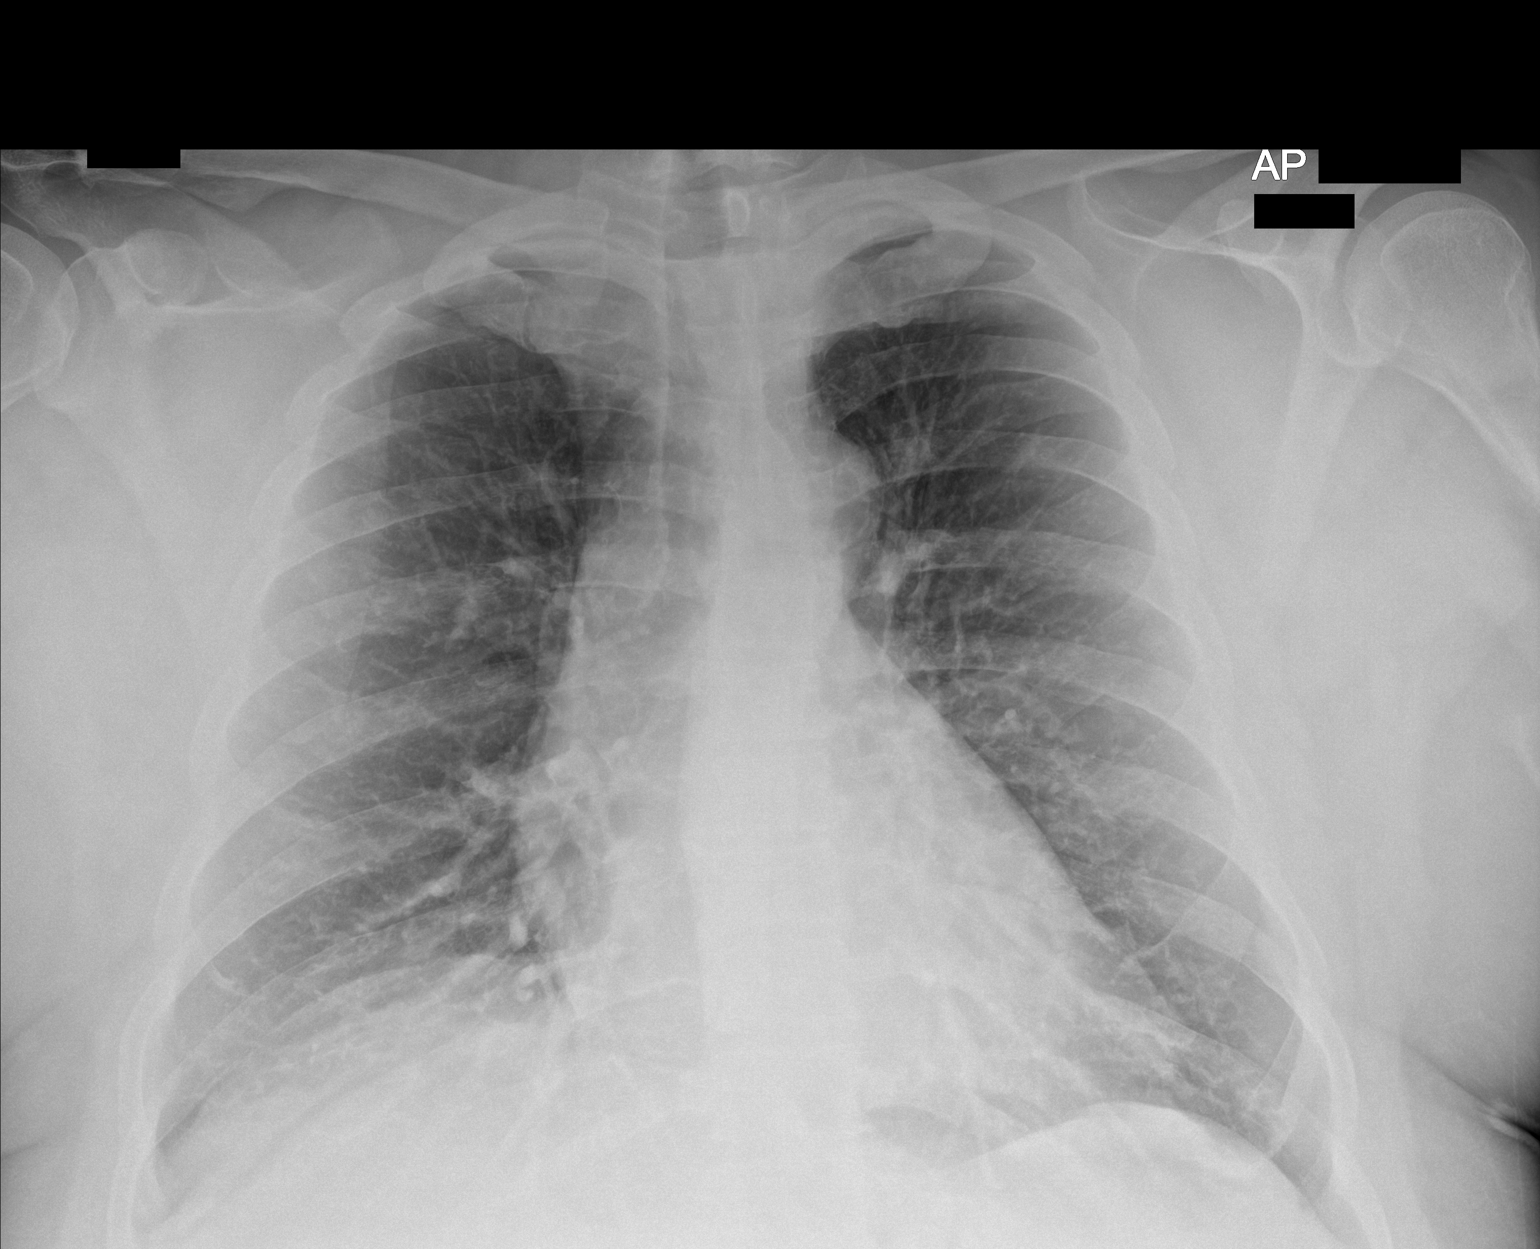

[chest ap (2 of 2)]
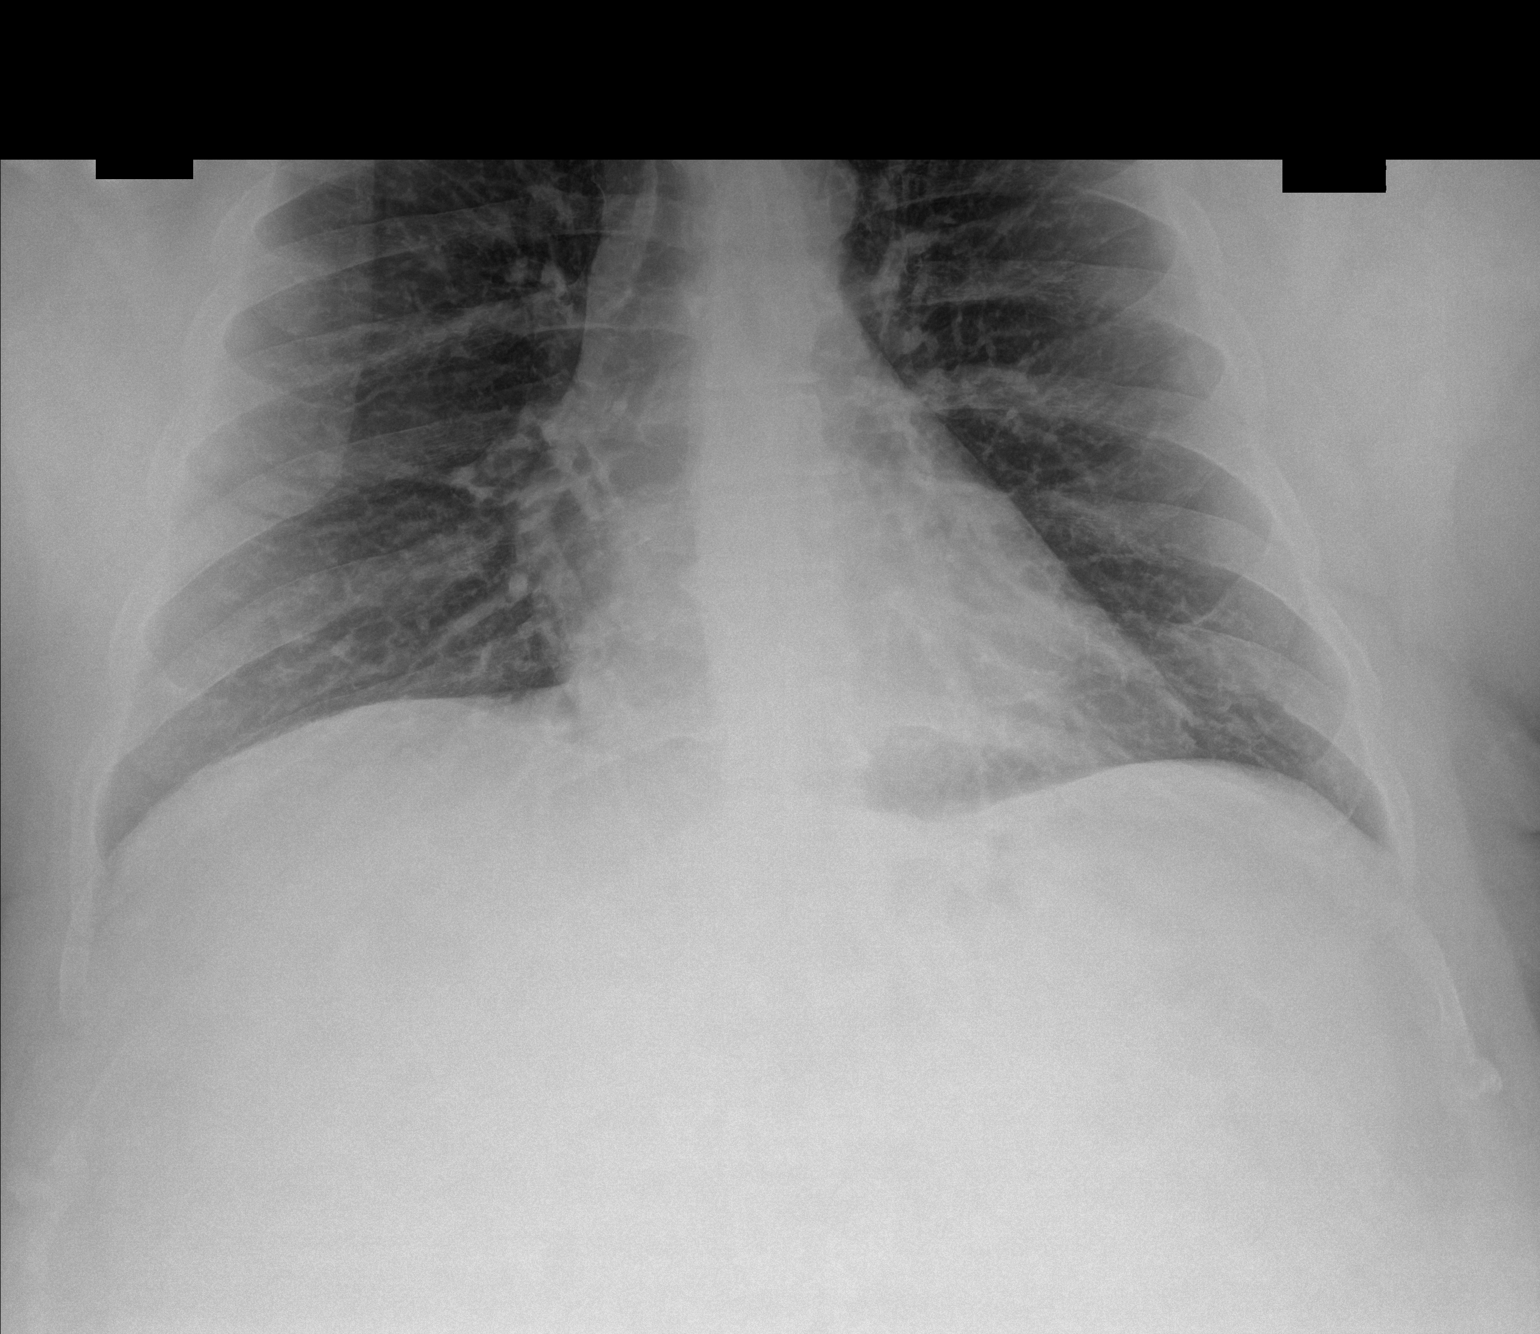

[2 of 2 positions shown; findings below may reference images not displayed]

FINDINGS: The heart size and mediastinal contours are within normal limits.
Both lungs are clear. The visualized skeletal structures are
unremarkable.
IMPRESSION: No active disease.
